# Patient Record
Sex: Male | Born: 1965 | Race: White | Hispanic: No | Marital: Single | Smoking: Current every day smoker
Health system: Southern US, Community
[De-identification: ages and names within clinical notes are randomized; demographics above are authoritative.]

## PROBLEM LIST (undated history)

## (undated) DIAGNOSIS — N3010 Interstitial cystitis (chronic) without hematuria: Secondary | ICD-10-CM

## (undated) DIAGNOSIS — I252 Old myocardial infarction: Secondary | ICD-10-CM

## (undated) DIAGNOSIS — I10 Essential (primary) hypertension: Secondary | ICD-10-CM

## (undated) DIAGNOSIS — I2510 Atherosclerotic heart disease of native coronary artery without angina pectoris: Secondary | ICD-10-CM

## (undated) DIAGNOSIS — E785 Hyperlipidemia, unspecified: Secondary | ICD-10-CM

## (undated) DIAGNOSIS — Z955 Presence of coronary angioplasty implant and graft: Secondary | ICD-10-CM

## (undated) DIAGNOSIS — K219 Gastro-esophageal reflux disease without esophagitis: Secondary | ICD-10-CM

## (undated) HISTORY — PX: CARDIAC CATHETERIZATION: SHX172

## (undated) HISTORY — PX: CORONARY ANGIOPLASTY: SHX604

## (undated) HISTORY — DX: Hyperlipidemia, unspecified: E78.5

## (undated) HISTORY — PX: CHOLECYSTECTOMY: SHX55

---

## 2010-06-06 DIAGNOSIS — I2510 Atherosclerotic heart disease of native coronary artery without angina pectoris: Secondary | ICD-10-CM | POA: Insufficient documentation

## 2010-06-06 DIAGNOSIS — F339 Major depressive disorder, recurrent, unspecified: Secondary | ICD-10-CM | POA: Insufficient documentation

## 2010-09-08 DIAGNOSIS — N3010 Interstitial cystitis (chronic) without hematuria: Secondary | ICD-10-CM | POA: Diagnosis present

## 2011-01-09 DIAGNOSIS — I10 Essential (primary) hypertension: Secondary | ICD-10-CM | POA: Diagnosis present

## 2011-01-14 DIAGNOSIS — E785 Hyperlipidemia, unspecified: Secondary | ICD-10-CM | POA: Insufficient documentation

## 2011-09-02 DIAGNOSIS — R3915 Urgency of urination: Secondary | ICD-10-CM | POA: Insufficient documentation

## 2011-09-02 DIAGNOSIS — R351 Nocturia: Secondary | ICD-10-CM | POA: Insufficient documentation

## 2011-09-02 DIAGNOSIS — R350 Frequency of micturition: Secondary | ICD-10-CM | POA: Insufficient documentation

## 2011-09-02 DIAGNOSIS — M549 Dorsalgia, unspecified: Secondary | ICD-10-CM | POA: Diagnosis present

## 2012-06-08 DIAGNOSIS — F419 Anxiety disorder, unspecified: Secondary | ICD-10-CM | POA: Insufficient documentation

## 2012-12-17 ENCOUNTER — Encounter (HOSPITAL_COMMUNITY): Payer: Self-pay | Admitting: Emergency Medicine

## 2012-12-17 ENCOUNTER — Emergency Department (HOSPITAL_COMMUNITY)
Admission: EM | Admit: 2012-12-17 | Discharge: 2012-12-17 | Disposition: A | Discharge status: Home / Self Care | Payer: Medicare Other | Attending: Emergency Medicine | Admitting: Emergency Medicine

## 2012-12-17 ENCOUNTER — Emergency Department (HOSPITAL_COMMUNITY): Payer: Medicare Other

## 2012-12-17 DIAGNOSIS — F17200 Nicotine dependence, unspecified, uncomplicated: Secondary | ICD-10-CM | POA: Insufficient documentation

## 2012-12-17 DIAGNOSIS — Z87448 Personal history of other diseases of urinary system: Secondary | ICD-10-CM | POA: Insufficient documentation

## 2012-12-17 DIAGNOSIS — S8002XA Contusion of left knee, initial encounter: Secondary | ICD-10-CM

## 2012-12-17 DIAGNOSIS — IMO0002 Reserved for concepts with insufficient information to code with codable children: Secondary | ICD-10-CM | POA: Insufficient documentation

## 2012-12-17 DIAGNOSIS — W19XXXA Unspecified fall, initial encounter: Secondary | ICD-10-CM

## 2012-12-17 DIAGNOSIS — I252 Old myocardial infarction: Secondary | ICD-10-CM | POA: Insufficient documentation

## 2012-12-17 DIAGNOSIS — S8001XA Contusion of right knee, initial encounter: Secondary | ICD-10-CM

## 2012-12-17 DIAGNOSIS — S80211A Abrasion, right knee, initial encounter: Secondary | ICD-10-CM

## 2012-12-17 DIAGNOSIS — Y9301 Activity, walking, marching and hiking: Secondary | ICD-10-CM | POA: Insufficient documentation

## 2012-12-17 DIAGNOSIS — W108XXA Fall (on) (from) other stairs and steps, initial encounter: Secondary | ICD-10-CM | POA: Insufficient documentation

## 2012-12-17 DIAGNOSIS — Z8679 Personal history of other diseases of the circulatory system: Secondary | ICD-10-CM | POA: Insufficient documentation

## 2012-12-17 DIAGNOSIS — Z79899 Other long term (current) drug therapy: Secondary | ICD-10-CM | POA: Insufficient documentation

## 2012-12-17 DIAGNOSIS — Z7982 Long term (current) use of aspirin: Secondary | ICD-10-CM | POA: Insufficient documentation

## 2012-12-17 DIAGNOSIS — Y92410 Unspecified street and highway as the place of occurrence of the external cause: Secondary | ICD-10-CM | POA: Insufficient documentation

## 2012-12-17 DIAGNOSIS — S8000XA Contusion of unspecified knee, initial encounter: Secondary | ICD-10-CM | POA: Insufficient documentation

## 2012-12-17 HISTORY — DX: Presence of coronary angioplasty implant and graft: Z95.5

## 2012-12-17 HISTORY — DX: Old myocardial infarction: I25.2

## 2012-12-17 HISTORY — DX: Interstitial cystitis (chronic) without hematuria: N30.10

## 2012-12-17 MED ORDER — NAPROXEN 500 MG PO TABS: 500.0000 mg | ORAL_TABLET | Freq: Two times a day (BID) | ORAL | Status: DC

## 2012-12-17 NOTE — ED Notes (Signed)
Patient states he tripped while coming down some stairs and landed on his knees.  Abrasions noted to right knee and patient c/o bilateral knee pain.

## 2012-12-17 NOTE — ED Notes (Signed)
Patient transported to X-ray 

## 2012-12-17 NOTE — ED Provider Notes (Signed)
History     CSN: [id number]  Arrival date & time 12/17/12  1242   First MD Initiated Contact with Patient 12/17/12 1350      Chief Complaint  Patient presents with  . Knee Pain    bilateral    (Consider location/radiation/quality/duration/timing/severity/associated sxs/prior treatment) HPI Joshua Rojas is a 47 y.o. male who presents to ED with complaint of a fall yesterday. States missed last step and landed on sidewalk. Lacerations to bilateral knees and pain to bilateral knees when walking and straightening her knees. states no weakness or numbness or injury distal to the knees. Full ROM of both knees. No other complaints. No head injury. Taking norco with no improvement.   Past Medical History  Diagnosis Date  . MI, old   . H/O heart artery stent      x 1  . Interstitial cystitis     Past Surgical History  Procedure Laterality Date  . Cholecystectomy      No family history on file.  History  Substance Use Topics  . Smoking status: Current Every Day Smoker -- 1.00 packs/day    Types: Cigarettes  . Smokeless tobacco: Not on file  . Alcohol Use: No      Review of Systems  Constitutional: Negative for fever and chills.  Musculoskeletal: Positive for joint swelling and arthralgias.  Skin: Positive for wound.  Neurological: Negative for weakness and numbness.    Allergies  Sudafed  Home Medications   Current Outpatient Rx  Name  Route  Sig  Dispense  Refill  . ALPRAZolam (XANAX) 1 MG tablet   Oral   Take 1 mg by mouth 3 (three) times daily.         Marland Kitchen aspirin EC 81 MG tablet   Oral   Take 81 mg by mouth every morning.         . citalopram (CELEXA) 40 MG tablet   Oral   Take 40 mg by mouth every evening.         . gabapentin (NEURONTIN) 600 MG tablet   Oral   Take 600 mg by mouth 3 (three) times daily.         Marland Kitchen HYDROcodone-acetaminophen (NORCO) 10-325 MG per tablet   Oral   Take 0.5-1 tablets by mouth every 6 (six) hours as needed for  pain.         . hydrOXYzine (ATARAX/VISTARIL) 25 MG tablet   Oral   Take 25 mg by mouth every evening.         . metoprolol succinate (TOPROL-XL) 100 MG 24 hr tablet   Oral   Take 100 mg by mouth every morning. Take with or immediately following a meal.         . simvastatin (ZOCOR) 20 MG tablet   Oral   Take 20 mg by mouth every evening.         . Tamsulosin HCl (FLOMAX) 0.4 MG CAPS   Oral   Take 0.4 mg by mouth every evening.         . Vitamin D, Ergocalciferol, (DRISDOL) 50000 UNITS CAPS   Oral   Take 50,000 Units by mouth every morning.           BP 130/83  Pulse 81  Temp(Src) 99.9 F (37.7 C) (Oral)  Ht 5\' 11"  (1.803 m)  Wt 260 lb (117.935 kg)  BMI 36.28 kg/m2  SpO2 98%  Physical Exam  Nursing note and vitals reviewed. Constitutional: He is oriented to person,  place, and time. He appears well-developed and well-nourished. No distress.  Cardiovascular: Normal rate, regular rhythm and normal heart sounds.   Pulmonary/Chest: Effort normal and breath sounds normal. No respiratory distress. He has no wheezes. He has no rales.  Musculoskeletal:  Abrasion to the right anterior knee. Tender to the bilateral tibial tuberosities. Full ROM of bilateral knees. Pain with extension of the knee joint against resistance. Negative anterior and posterior drawer signs. Joint stable. Patella tendon intact bilaterally  Neurological: He is alert and oriented to person, place, and time.  Skin: Skin is warm and dry.    ED Course  Procedures (including critical care time)  Labs Reviewed - No data to display Dg Knee Complete 4 Views Left  12/17/2012  *RADIOLOGY REPORT*  Clinical Data: Fall, bilateral knee pain.  LEFT KNEE - COMPLETE 4+ VIEW  Comparison: Right knee performed today.  Findings: No acute bony abnormality.  Specifically, no fracture, subluxation, or dislocation.  Soft tissues are intact. Joint spaces are maintained.  Normal bone mineralization.  No joint effusion.   IMPRESSION: No bony abnormality.   Original Report Authenticated By: Charlett Nose, M.D.    Dg Knee Complete 4 Views Right  12/17/2012  *RADIOLOGY REPORT*  Clinical Data: Fall, bilateral knee pain.  RIGHT KNEE - COMPLETE 4+ VIEW  Comparison: Left knee performed today.  Findings: No acute bony abnormality.  Specifically, no fracture, subluxation, or dislocation.  Soft tissues are intact. Joint spaces are maintained.  Normal bone mineralization.  No joint effusion.  IMPRESSION: No bony abnormality.   Original Report Authenticated By: Charlett Nose, M.D.      1. Contusion of knee, left   2. Contusion of knee, right   3. Abrasion of right knee   4. Fall       MDM  Pt with bilateral knee pain post mechanical fall yesterday. He is ambulatory. No distress. Joints stable bilaterally, full ROM. Tender over patella and tibial tuberosities. X-rays negative. Suspect contusion at the patella insertion site. Patella tendon intact. D/c home with follow up as needed.        Lottie Mussel, PA 12/17/12 1549

## 2012-12-17 NOTE — ED Provider Notes (Signed)
Medical screening examination/treatment/procedure(s) were performed by non-physician practitioner and as supervising physician I was immediately available for consultation/collaboration.  Marwan T Powers, MD 12/17/12 1649 

## 2012-12-19 DIAGNOSIS — K219 Gastro-esophageal reflux disease without esophagitis: Secondary | ICD-10-CM | POA: Diagnosis present

## 2013-07-12 DIAGNOSIS — E669 Obesity, unspecified: Secondary | ICD-10-CM | POA: Diagnosis present

## 2013-07-12 DIAGNOSIS — Z720 Tobacco use: Secondary | ICD-10-CM | POA: Insufficient documentation

## 2013-11-24 ENCOUNTER — Emergency Department (HOSPITAL_COMMUNITY): Payer: Medicare Other

## 2013-11-24 ENCOUNTER — Emergency Department (HOSPITAL_COMMUNITY)
Admission: EM | Admit: 2013-11-24 | Discharge: 2013-11-24 | Disposition: A | Discharge status: Home / Self Care | Payer: Medicare Other | Attending: Emergency Medicine | Admitting: Emergency Medicine

## 2013-11-24 ENCOUNTER — Encounter (HOSPITAL_COMMUNITY): Payer: Self-pay | Admitting: Emergency Medicine

## 2013-11-24 DIAGNOSIS — IMO0001 Reserved for inherently not codable concepts without codable children: Secondary | ICD-10-CM | POA: Insufficient documentation

## 2013-11-24 DIAGNOSIS — Z87448 Personal history of other diseases of urinary system: Secondary | ICD-10-CM | POA: Insufficient documentation

## 2013-11-24 DIAGNOSIS — J069 Acute upper respiratory infection, unspecified: Secondary | ICD-10-CM | POA: Insufficient documentation

## 2013-11-24 DIAGNOSIS — Z791 Long term (current) use of non-steroidal anti-inflammatories (NSAID): Secondary | ICD-10-CM | POA: Insufficient documentation

## 2013-11-24 DIAGNOSIS — F17200 Nicotine dependence, unspecified, uncomplicated: Secondary | ICD-10-CM | POA: Insufficient documentation

## 2013-11-24 DIAGNOSIS — Z7982 Long term (current) use of aspirin: Secondary | ICD-10-CM | POA: Insufficient documentation

## 2013-11-24 DIAGNOSIS — Z9861 Coronary angioplasty status: Secondary | ICD-10-CM | POA: Insufficient documentation

## 2013-11-24 DIAGNOSIS — I252 Old myocardial infarction: Secondary | ICD-10-CM | POA: Insufficient documentation

## 2013-11-24 DIAGNOSIS — Z79899 Other long term (current) drug therapy: Secondary | ICD-10-CM | POA: Insufficient documentation

## 2013-11-24 DIAGNOSIS — R079 Chest pain, unspecified: Secondary | ICD-10-CM | POA: Insufficient documentation

## 2013-11-24 MED ORDER — HYDROCODONE-HOMATROPINE 5-1.5 MG/5ML PO SYRP: 5.0000 mL | ORAL_SOLUTION | Freq: Four times a day (QID) | ORAL | Status: DC | PRN

## 2013-11-24 MED ORDER — AZITHROMYCIN 250 MG PO TABS: ORAL_TABLET | ORAL | Status: DC

## 2013-11-24 NOTE — ED Provider Notes (Signed)
CSN: [id number]     Arrival date & time 11/24/13  1511 History  This chart was scribed for non-physician practitioner, Arthor CaptainAbigail Angelo Caroll, PA-C working with Junius ArgyleForrest S Harrison, MD by Greggory StallionKayla Andersen, ED scribe. This patient was seen in room WTR7/WTR7 and the patient's care was started at 4:59 PM.   Chief Complaint  Patient presents with  . Nasal Congestion    2 day hx of sinus pressure  . Headache  . Cough  . Chest Pain    pressure with cough    The history is provided by the patient. No language interpreter was used.   HPI Comments: Joshua Rojas is a 48 y.o. male with history of interstitial cystitis who presents to the Emergency Department complaining of nasal congestion, sinus pressure, mild headache, chills, generalized body aches and productive cough that started 2 days ago. Pt states he called his PCP and was told to come here if he might have the flu.Patient states that he is afraid to take typical OTC cold meds because they affect his  Interstitial cystitis negatively. Denies wheezing, rash. Denies history of asthma. Pt smokes cigarettes daily.   Past Medical History  Diagnosis Date  . MI, old   . H/O heart artery stent      x 1  . Interstitial cystitis    Past Surgical History  Procedure Laterality Date  . Cholecystectomy     No family history on file. History  Substance Use Topics  . Smoking status: Current Every Day Smoker -- 1.00 packs/day    Types: Cigarettes  . Smokeless tobacco: Not on file  . Alcohol Use: No    Review of Systems  Constitutional: Positive for chills. Negative for fever.  HENT: Positive for congestion, postnasal drip and sinus pressure.   Eyes: Negative for redness.  Respiratory: Positive for cough. Negative for wheezing.   Gastrointestinal: Negative for abdominal pain.  Musculoskeletal: Positive for myalgias.  Skin: Negative for rash.  Neurological: Positive for headaches.  Psychiatric/Behavioral: Negative for confusion.    Allergies   Sudafed  Home Medications   Current Outpatient Rx  Name  Route  Sig  Dispense  Refill  . ALPRAZolam (XANAX) 1 MG tablet   Oral   Take 1 mg by mouth 3 (three) times daily.         Marland Kitchen. aspirin EC 81 MG tablet   Oral   Take 81 mg by mouth every morning.         . citalopram (CELEXA) 40 MG tablet   Oral   Take 40 mg by mouth every evening.         . gabapentin (NEURONTIN) 600 MG tablet   Oral   Take 600 mg by mouth 3 (three) times daily.         Marland Kitchen. HYDROcodone-acetaminophen (NORCO) 10-325 MG per tablet   Oral   Take 0.5-1 tablets by mouth every 6 (six) hours as needed for pain.         . hydrOXYzine (ATARAX/VISTARIL) 25 MG tablet   Oral   Take 25 mg by mouth every evening.         . metoprolol succinate (TOPROL-XL) 100 MG 24 hr tablet   Oral   Take 100 mg by mouth every morning. Take with or immediately following a meal.         . naproxen (NAPROSYN) 500 MG tablet   Oral   Take 1 tablet (500 mg total) by mouth 2 (two) times daily.   30 tablet  0   . simvastatin (ZOCOR) 20 MG tablet   Oral   Take 20 mg by mouth every evening.         . Tamsulosin HCl (FLOMAX) 0.4 MG CAPS   Oral   Take 0.4 mg by mouth every evening.         . Vitamin D, Ergocalciferol, (DRISDOL) 50000 UNITS CAPS   Oral   Take 50,000 Units by mouth every morning.          BP 122/78  Pulse 96  Temp(Src) 98.8 F (37.1 C) (Oral)  Resp 18  Wt 270 lb (122.471 kg)  SpO2 98%  Physical Exam  Nursing note and vitals reviewed. Constitutional: He is oriented to person, place, and time. He appears well-developed and well-nourished. No distress.  HENT:  Head: Normocephalic and atraumatic.  Post nasal drip noted.   Eyes: EOM are normal.  Neck: Neck supple. No tracheal deviation present.  Cardiovascular: Normal rate, regular rhythm and normal heart sounds.   Pulmonary/Chest: Effort normal and breath sounds normal. No respiratory distress. He has no wheezes. He has no rales.   Abdominal: Soft. There is no tenderness. There is no rebound and no guarding.  Musculoskeletal: Normal range of motion.  Neurological: He is alert and oriented to person, place, and time.  Skin: Skin is warm and dry.  Psychiatric: He has a normal mood and affect. His behavior is normal.    ED Course  Procedures (including critical care time)  DIAGNOSTIC STUDIES: Oxygen Saturation is 98% on RA, normal by my interpretation.    COORDINATION OF CARE: 5:06 PM-Discussed treatment plan which includes cough medication with pt at bedside and pt agreed to plan. Advised pt to fill the antibiotic prescription if symptoms do not resolve in two weeks.   Labs Review Labs Reviewed - No data to display Imaging Review Dg Chest 2 View  11/24/2013   CLINICAL DATA:  Nasal congestion.  Headache and cough.  EXAM: CHEST  2 VIEW  COMPARISON:  None.  FINDINGS: Borderline cardiomegaly is present. Diffuse interstitial coarsening is evident, likely chronic. No focal airspace consolidation is present. The visualized soft tissues and bony thorax are unremarkable.  IMPRESSION: 1. No acute cardiopulmonary disease. 2. Diffuse interstitial coarsening is likely chronic.   Electronically Signed   By: Gennette Pac M.D.   On: 11/24/2013 16:54    EKG Interpretation   None       MDM   1. URI (upper respiratory infection)    Patient given RX for abx. Asked to hold off unless sxs persist for more than 14 days. Pt CXR negative for acute infiltrate. Patients symptoms are consistent with URI, likely viral etiology. Discussed that antibiotics are not indicated for viral infections. Pt will be discharged with symptomatic treatment.  Verbalizes understanding and is agreeable with plan. Pt is hemodynamically stable & in NAD prior to dc.  I personally performed the services described in this documentation, which was scribed in my presence. The recorded information has been reviewed and is accurate.    Arthor Captain,  PA-C 11/25/13 [phone]

## 2013-11-24 NOTE — ED Notes (Signed)
Pt reports that he has sinus pressure, headache, chills, productive cough x 2 days. Did not tx with OTC meds Lungs clear, anterior, posterior. Slight decrease in posterior bases. VS stable.

## 2013-11-24 NOTE — Discharge Instructions (Signed)

## 2013-11-25 NOTE — ED Provider Notes (Signed)
Medical screening examination/treatment/procedure(s) were performed by non-physician practitioner and as supervising physician I was immediately available for consultation/collaboration.  EKG Interpretation   None         Kiahna Banghart S Brooklyn Alfredo, MD 11/25/13 1140 

## 2014-02-07 DIAGNOSIS — R52 Pain, unspecified: Secondary | ICD-10-CM | POA: Insufficient documentation

## 2015-07-25 DIAGNOSIS — J309 Allergic rhinitis, unspecified: Secondary | ICD-10-CM | POA: Insufficient documentation

## 2015-08-15 ENCOUNTER — Other Ambulatory Visit: Payer: Self-pay | Admitting: Urology

## 2015-08-15 DIAGNOSIS — R319 Hematuria, unspecified: Secondary | ICD-10-CM

## 2015-08-15 DIAGNOSIS — R109 Unspecified abdominal pain: Secondary | ICD-10-CM

## 2015-08-22 ENCOUNTER — Ambulatory Visit
Admission: RE | Admit: 2015-08-22 | Discharge: 2015-08-22 | Disposition: A | Discharge status: Home / Self Care | Payer: Medicare Other | Source: Ambulatory Visit | Attending: Urology | Admitting: Urology

## 2015-08-22 ENCOUNTER — Other Ambulatory Visit: Payer: Self-pay | Admitting: Urology

## 2015-08-22 DIAGNOSIS — R109 Unspecified abdominal pain: Secondary | ICD-10-CM

## 2015-08-22 DIAGNOSIS — R319 Hematuria, unspecified: Secondary | ICD-10-CM

## 2015-08-22 MED ORDER — IOPAMIDOL (ISOVUE-300) INJECTION 61%
125.0000 mL | Freq: Once | INTRAVENOUS | Status: AC | PRN
  Administered 2015-08-22: 125 mL via INTRAVENOUS

## 2017-04-22 DIAGNOSIS — Z79891 Long term (current) use of opiate analgesic: Secondary | ICD-10-CM | POA: Insufficient documentation

## 2018-01-24 DIAGNOSIS — Z8639 Personal history of other endocrine, nutritional and metabolic disease: Secondary | ICD-10-CM | POA: Insufficient documentation

## 2018-02-25 ENCOUNTER — Encounter (HOSPITAL_COMMUNITY): Payer: Self-pay

## 2018-02-25 ENCOUNTER — Other Ambulatory Visit: Payer: Self-pay

## 2018-02-25 ENCOUNTER — Emergency Department (HOSPITAL_COMMUNITY): Payer: Medicare Other

## 2018-02-25 ENCOUNTER — Inpatient Hospital Stay (HOSPITAL_COMMUNITY)
Admission: EM | Admit: 2018-02-25 | Discharge: 2018-03-01 | DRG: 247 | Disposition: A | Discharge status: Home / Self Care | Payer: Medicare Other | Attending: Internal Medicine | Admitting: Internal Medicine

## 2018-02-25 DIAGNOSIS — E785 Hyperlipidemia, unspecified: Secondary | ICD-10-CM | POA: Diagnosis present

## 2018-02-25 DIAGNOSIS — N3010 Interstitial cystitis (chronic) without hematuria: Secondary | ICD-10-CM | POA: Diagnosis not present

## 2018-02-25 DIAGNOSIS — R072 Precordial pain: Secondary | ICD-10-CM

## 2018-02-25 DIAGNOSIS — I214 Non-ST elevation (NSTEMI) myocardial infarction: Secondary | ICD-10-CM | POA: Diagnosis not present

## 2018-02-25 DIAGNOSIS — I2510 Atherosclerotic heart disease of native coronary artery without angina pectoris: Secondary | ICD-10-CM | POA: Diagnosis not present

## 2018-02-25 DIAGNOSIS — I10 Essential (primary) hypertension: Secondary | ICD-10-CM | POA: Diagnosis not present

## 2018-02-25 DIAGNOSIS — K219 Gastro-esophageal reflux disease without esophagitis: Secondary | ICD-10-CM | POA: Diagnosis present

## 2018-02-25 DIAGNOSIS — I252 Old myocardial infarction: Secondary | ICD-10-CM

## 2018-02-25 DIAGNOSIS — Z79899 Other long term (current) drug therapy: Secondary | ICD-10-CM

## 2018-02-25 DIAGNOSIS — R001 Bradycardia, unspecified: Secondary | ICD-10-CM | POA: Diagnosis not present

## 2018-02-25 DIAGNOSIS — F329 Major depressive disorder, single episode, unspecified: Secondary | ICD-10-CM | POA: Diagnosis present

## 2018-02-25 DIAGNOSIS — Z955 Presence of coronary angioplasty implant and graft: Secondary | ICD-10-CM

## 2018-02-25 DIAGNOSIS — T50905A Adverse effect of unspecified drugs, medicaments and biological substances, initial encounter: Secondary | ICD-10-CM | POA: Diagnosis not present

## 2018-02-25 DIAGNOSIS — F17210 Nicotine dependence, cigarettes, uncomplicated: Secondary | ICD-10-CM | POA: Diagnosis present

## 2018-02-25 DIAGNOSIS — F32A Depression, unspecified: Secondary | ICD-10-CM | POA: Diagnosis present

## 2018-02-25 DIAGNOSIS — Z6836 Body mass index (BMI) 36.0-36.9, adult: Secondary | ICD-10-CM

## 2018-02-25 DIAGNOSIS — M549 Dorsalgia, unspecified: Secondary | ICD-10-CM | POA: Diagnosis present

## 2018-02-25 DIAGNOSIS — E669 Obesity, unspecified: Secondary | ICD-10-CM | POA: Diagnosis present

## 2018-02-25 DIAGNOSIS — R079 Chest pain, unspecified: Secondary | ICD-10-CM | POA: Diagnosis present

## 2018-02-25 DIAGNOSIS — F419 Anxiety disorder, unspecified: Secondary | ICD-10-CM | POA: Diagnosis present

## 2018-02-25 DIAGNOSIS — Z888 Allergy status to other drugs, medicaments and biological substances status: Secondary | ICD-10-CM

## 2018-02-25 DIAGNOSIS — Z7982 Long term (current) use of aspirin: Secondary | ICD-10-CM

## 2018-02-25 HISTORY — DX: Gastro-esophageal reflux disease without esophagitis: K21.9

## 2018-02-25 HISTORY — DX: Essential (primary) hypertension: I10

## 2018-02-25 HISTORY — DX: Atherosclerotic heart disease of native coronary artery without angina pectoris: I25.10

## 2018-02-25 LAB — BASIC METABOLIC PANEL
Anion gap: 13 (ref 5–15)
BUN: 8 mg/dL (ref 6–20)
CO2: 20 mmol/L — ABNORMAL LOW (ref 22–32)
CREATININE: 1 mg/dL (ref 0.61–1.24)
Calcium: 8.5 mg/dL — ABNORMAL LOW (ref 8.9–10.3)
Chloride: 107 mmol/L (ref 101–111)
GFR calc Af Amer: >60 mL/min (ref >60)
GFR calc non Af Amer: >60 mL/min (ref >60)
GLUCOSE: 186 mg/dL — AB (ref 65–99)
POTASSIUM: 3.6 mmol/L (ref 3.5–5.1)
Sodium: 140 mmol/L (ref 135–145)

## 2018-02-25 LAB — HEPATIC FUNCTION PANEL
ALT: 31 U/L (ref 17–63)
AST: 29 U/L (ref 15–41)
Albumin: 3.9 g/dL (ref 3.5–5.0)
Alkaline Phosphatase: 69 U/L (ref 38–126)
Bilirubin, Direct: <0.1 mg/dL — ABNORMAL LOW (ref 0.1–0.5)
Total Bilirubin: 0.8 mg/dL (ref 0.3–1.2)
Total Protein: 6.6 g/dL (ref 6.5–8.1)

## 2018-02-25 LAB — CBC
HEMATOCRIT: 47.2 % (ref 39.0–52.0)
Hemoglobin: 16.5 g/dL (ref 13.0–17.0)
MCH: 31.9 pg (ref 26.0–34.0)
MCHC: 35 g/dL (ref 30.0–36.0)
MCV: 91.3 fL (ref 78.0–100.0)
PLATELETS: 176 10*3/uL (ref 150–400)
RBC: 5.17 MIL/uL (ref 4.22–5.81)
RDW: 12.4 % (ref 11.5–15.5)
WBC: 10.4 10*3/uL (ref 4.0–10.5)

## 2018-02-25 LAB — D-DIMER, QUANTITATIVE (NOT AT ARMC): D DIMER QUANT: 0.27 ug{FEU}/mL (ref 0.00–0.50)

## 2018-02-25 LAB — LIPASE, BLOOD: LIPASE: 43 U/L (ref 11–51)

## 2018-02-25 LAB — TSH: TSH: 0.572 u[IU]/mL (ref 0.350–4.500)

## 2018-02-25 LAB — I-STAT TROPONIN, ED
Troponin i, poc: 0.02 ng/mL (ref 0.00–0.08)
Troponin i, poc: 0.04 ng/mL (ref 0.00–0.08)

## 2018-02-25 LAB — BRAIN NATRIURETIC PEPTIDE: B NATRIURETIC PEPTIDE 5: 50.1 pg/mL (ref 0.0–100.0)

## 2018-02-25 MED ORDER — ALPRAZOLAM 0.5 MG PO TABS: 0.5000 mg | ORAL_TABLET | ORAL | Status: DC

## 2018-02-25 MED ORDER — ALPRAZOLAM 0.5 MG PO TABS
2.0000 mg | ORAL_TABLET | Freq: Every day | ORAL | Status: DC
  Administered 2018-02-25 – 2018-02-28 (×4): 2 mg via ORAL
  Filled 2018-02-25 (×4): qty 4

## 2018-02-25 MED ORDER — ONDANSETRON HCL 4 MG/2ML IJ SOLN: 4.0000 mg | Freq: Four times a day (QID) | INTRAMUSCULAR | Status: DC | PRN

## 2018-02-25 MED ORDER — HYDROCODONE-ACETAMINOPHEN 10-325 MG PO TABS
0.5000 | ORAL_TABLET | ORAL | Status: DC
  Administered 2018-02-25 – 2018-02-26 (×2): 1 via ORAL
  Filled 2018-02-25 (×2): qty 1

## 2018-02-25 MED ORDER — ACETAMINOPHEN 325 MG PO TABS: 650.0000 mg | ORAL_TABLET | ORAL | Status: DC | PRN

## 2018-02-25 MED ORDER — CITALOPRAM HYDROBROMIDE 20 MG PO TABS
40.0000 mg | ORAL_TABLET | Freq: Every day | ORAL | Status: DC
  Administered 2018-02-25 – 2018-02-28 (×4): 40 mg via ORAL
  Filled 2018-02-25: qty 4
  Filled 2018-02-25 (×3): qty 2

## 2018-02-25 MED ORDER — MORPHINE SULFATE (PF) 4 MG/ML IV SOLN: 4.0000 mg | Freq: Once | INTRAVENOUS | Status: DC

## 2018-02-25 MED ORDER — HYDROXYZINE HCL 25 MG PO TABS
25.0000 mg | ORAL_TABLET | Freq: Every day | ORAL | Status: DC
  Administered 2018-02-25 – 2018-02-28 (×4): 25 mg via ORAL
  Filled 2018-02-25 (×5): qty 1

## 2018-02-25 MED ORDER — MORPHINE SULFATE (PF) 4 MG/ML IV SOLN
2.0000 mg | INTRAVENOUS | Status: DC | PRN
  Administered 2018-02-26 – 2018-02-28 (×3): 2 mg via INTRAVENOUS
  Filled 2018-02-25 (×3): qty 1

## 2018-02-25 MED ORDER — ONDANSETRON HCL 4 MG/2ML IJ SOLN: 4.0000 mg | Freq: Once | INTRAMUSCULAR | Status: DC

## 2018-02-25 MED ORDER — SIMVASTATIN 20 MG PO TABS
20.0000 mg | ORAL_TABLET | Freq: Every day | ORAL | Status: DC
  Administered 2018-02-26 (×2): 20 mg via ORAL
  Filled 2018-02-25 (×2): qty 1

## 2018-02-25 MED ORDER — NITROGLYCERIN 0.4 MG SL SUBL: 0.4000 mg | SUBLINGUAL_TABLET | SUBLINGUAL | Status: DC | PRN

## 2018-02-25 MED ORDER — GI COCKTAIL ~~LOC~~: 30.0000 mL | Freq: Four times a day (QID) | ORAL | Status: DC | PRN

## 2018-02-25 MED ORDER — ALPRAZOLAM 0.5 MG PO TABS
0.5000 mg | ORAL_TABLET | Freq: Two times a day (BID) | ORAL | Status: DC | PRN
  Administered 2018-02-26 (×2): 0.5 mg via ORAL
  Filled 2018-02-25 (×2): qty 1

## 2018-02-25 MED ORDER — METOPROLOL SUCCINATE ER 100 MG PO TB24
100.0000 mg | ORAL_TABLET | Freq: Every day | ORAL | Status: DC
  Administered 2018-02-26 – 2018-02-27 (×2): 100 mg via ORAL
  Filled 2018-02-25 (×2): qty 1

## 2018-02-25 MED ORDER — AZELASTINE HCL 0.1 % NA SOLN: 2.0000 | Freq: Every day | NASAL | Status: DC | PRN

## 2018-02-25 MED ORDER — PANTOPRAZOLE SODIUM 40 MG PO TBEC
40.0000 mg | DELAYED_RELEASE_TABLET | Freq: Every day | ORAL | Status: DC
  Administered 2018-02-25 – 2018-02-28 (×4): 40 mg via ORAL
  Filled 2018-02-25 (×4): qty 1

## 2018-02-25 MED ORDER — GABAPENTIN 300 MG PO CAPS
600.0000 mg | ORAL_CAPSULE | Freq: Three times a day (TID) | ORAL | Status: DC
  Administered 2018-02-25 – 2018-03-01 (×12): 600 mg via ORAL
  Filled 2018-02-25 (×12): qty 2

## 2018-02-25 MED ORDER — ENOXAPARIN SODIUM 40 MG/0.4ML ~~LOC~~ SOLN
40.0000 mg | Freq: Every day | SUBCUTANEOUS | Status: DC
  Administered 2018-02-26: 40 mg via SUBCUTANEOUS
  Filled 2018-02-25: qty 0.4

## 2018-02-25 MED ORDER — FLUTICASONE PROPIONATE 50 MCG/ACT NA SUSP: 2.0000 | Freq: Every day | NASAL | Status: DC | PRN

## 2018-02-25 MED ORDER — TAMSULOSIN HCL 0.4 MG PO CAPS
0.4000 mg | ORAL_CAPSULE | Freq: Every day | ORAL | Status: DC
  Administered 2018-02-25 – 2018-02-28 (×4): 0.4 mg via ORAL
  Filled 2018-02-25 (×4): qty 1

## 2018-02-25 NOTE — ED Provider Notes (Signed)
Medical screening examination/treatment/procedure(s) were conducted as a shared visit with non-physician practitioner(s) and myself.  I personally evaluated the patient during the encounter. Briefly, the patient is a 52 y.o. male with a history of prior MIs status post stenting, hypertension, hyperlipidemia, smoker who presents with substernal chest pain is similar to prior MI.  EKG without evidence of acute ischemia or pericarditis.  Initial troponin negative. Low pretest probability for pulmonary embolism.  Presentation not classic for aortic dissection or esophageal perforation. Chest x-ray without evidence suggestive of pneumonia, pneumothorax, pneumomediastinum.  No abnormal contour of the mediastinum to suggest dissection. No evidence of acute injuries. Patient high risk he will require admission for ACS rule out.      EKG Interpretation  Date/Time:  Friday February 25 2018 17:47:22 EDT Ventricular Rate:  66 PR Interval:    QRS Duration: 103 QT Interval:  427 QTC Calculation: 448 R Axis:   39 Text Interpretation:  Sinus rhythm Low voltage, precordial leads RSR' in V1 or V2, right VCD or RVH NO STEMI No old tracing to compare Confirmed by Drema Pryardama, Dewane Timson [phone] on 02/25/2018 6:06:30 PM           Eudelia Bunchardama, Amadeo GarnetPedro Eduardo, MD 02/25/18 2029

## 2018-02-25 NOTE — ED Provider Notes (Signed)
[facility] Provider Note   CSN: [id number] Arrival date & time: 02/25/18  1747     History   Chief Complaint Chief Complaint  Patient presents with  . Chest Pain    HPI Joshua Rojas is a 52 y.o. male.  Patient with history of MI approximately 20 years ago [place], no current cardiologist, history of hypertension high cholesterol, smoking --presents with acute onset of right-sided chest pain with radiation into his right neck and shoulder and also some mild epigastric pain.  Pain was present when he awoke from sleep at approximately 3:30 PM today.  He had some diaphoresis and nausea without vomiting.  EMS was called.  They gave aspirin and nitroglycerin.  Nitroglycerin helped the chest pain slightly, but caused his bladder to spasm (history of interstitial cystitis).  Pain is now worsened to a 6 out of 10.  Patient is not short of breath.  He reminds him somewhat of his previous MI.  He has had some similar short-lived mild pains recently not brought on with exertion. No recent stress testing.      Past Medical History:  Diagnosis Date  . H/O heart artery stent     x 1  . Hypertension   . Interstitial cystitis   . MI, old     There are no active problems to display for this patient.   Past Surgical History:  Procedure Laterality Date  . CHOLECYSTECTOMY          Home Medications    Prior to Admission medications   Medication Sig Start Date End Date Taking? Authorizing Provider  ALPRAZolam Prudy Feeler(XANAX) 1 MG tablet Take 1 mg by mouth 3 (three) times daily.    [provider]  aspirin EC 81 MG tablet Take 81 mg by mouth every morning.    [provider]  azithromycin (ZITHROMAX Z-PAK) 250 MG tablet 2 po day one, then 1 daily x 4 days 11/24/13   Arthor CaptainHarris, Abigail, PA-C  Cholecalciferol (VITAMIN D3) 5000 UNITS CAPS Take 1 capsule by mouth daily.    [provider]  citalopram (CELEXA)  40 MG tablet Take 40 mg by mouth every evening.    [provider]  gabapentin (NEURONTIN) 600 MG tablet Take 600 mg by mouth 3 (three) times daily.    [provider]  HYDROcodone-acetaminophen (NORCO) 10-325 MG per tablet Take 0.5-1 tablets by mouth every 6 (six) hours as needed for pain.    [provider]  HYDROcodone-homatropine (HYCODAN) 5-1.5 MG/5ML syrup Take 5 mLs by mouth every 6 (six) hours as needed for cough. 11/24/13   Arthor CaptainHarris, Abigail, PA-C  hydrOXYzine (ATARAX/VISTARIL) 25 MG tablet Take 25 mg by mouth every evening.    [provider]  metoprolol succinate (TOPROL-XL) 100 MG 24 hr tablet Take 100 mg by mouth every morning. Take with or immediately following a meal.    [provider]  simvastatin (ZOCOR) 20 MG tablet Take 20 mg by mouth every evening.    [provider]  Tamsulosin HCl (FLOMAX) 0.4 MG CAPS Take 0.4 mg by mouth every evening.    [provider]    Family History Family History  Problem Relation Age of Onset  . Diabetes Father     Social History Social History   Tobacco Use  . Smoking status: Current Every Day Smoker    Packs/day: 1.00    Types: Cigarettes  Substance Use Topics  . Alcohol use: No  .  Drug use: No     Allergies   Benadryl [diphenhydramine hcl]; Sudafed [pseudoephedrine]; and Triprolidine-pse   Review of Systems Review of Systems  Constitutional: Negative for diaphoresis and fever.  Eyes: Negative for redness.  Respiratory: Negative for cough and shortness of breath.   Cardiovascular: Positive for chest pain. Negative for palpitations and leg swelling.  Gastrointestinal: Positive for abdominal pain. Negative for nausea and vomiting.  Genitourinary: Negative for dysuria.  Musculoskeletal: Negative for back pain and neck pain.  Skin: Negative for rash.  Neurological: Negative for syncope and light-headedness.  Psychiatric/Behavioral: The patient is not nervous/anxious.       Physical Exam Updated Vital Signs BP 130/66 (BP Location: Right Arm)   Pulse 65   Temp 98.4 F (36.9 C) (Oral)   Resp 16   Ht 5\' 11"  (1.803 m)   Wt 122.5 kg (270 lb)   SpO2 93%   BMI 37.66 kg/m   Physical Exam  Constitutional: He appears well-developed and well-nourished.  HENT:  Head: Normocephalic and atraumatic.  Mouth/Throat: Mucous membranes are normal. Mucous membranes are not dry.  Eyes: Conjunctivae are normal.  Neck: Trachea normal and normal range of motion. Neck supple. Normal carotid pulses and no JVD present. No muscular tenderness present. Carotid bruit is not present. No tracheal deviation present.  Cardiovascular: Normal rate, regular rhythm, S1 normal, S2 normal, normal heart sounds and intact distal pulses. Exam reveals no distant heart sounds and no decreased pulses.  No murmur heard. Pulmonary/Chest: Effort normal and breath sounds normal. No respiratory distress. He has no wheezes. He exhibits no tenderness.  Abdominal: Soft. Normal aorta and bowel sounds are normal. There is no tenderness. There is no rebound and no guarding.  Musculoskeletal: He exhibits no edema.  Neurological: He is alert.  Skin: Skin is warm and dry. He is not diaphoretic. No cyanosis. No pallor.  Psychiatric: He has a normal mood and affect.  Nursing note and vitals reviewed.    ED Treatments / Results  Labs (all labs ordered are listed, but only abnormal results are displayed) Labs Reviewed  BASIC METABOLIC PANEL - Abnormal; Notable for the following components:      Result Value   CO2 20 (*)    Glucose, Bld 186 (*)    Calcium 8.5 (*)    All other components within normal limits  HEPATIC FUNCTION PANEL - Abnormal; Notable for the following components:   Bilirubin, Direct <0.1 (*)    All other components within normal limits  CBC  LIPASE, BLOOD  I-STAT TROPONIN, ED  I-STAT TROPONIN, ED    EKG EKG Interpretation  Date/Time:  Friday February 25 2018 17:47:22  EDT Ventricular Rate:  66 PR Interval:    QRS Duration: 103 QT Interval:  427 QTC Calculation: 448 R Axis:   39 Text Interpretation:  Sinus rhythm Low voltage, precordial leads RSR' in V1 or V2, right VCD or RVH NO STEMI No old tracing to compare Confirmed by Drema Pry [phone] on 02/25/2018 6:06:30 PM   Radiology No results found.  Procedures Procedures (including critical care time)  Medications Ordered in ED Medications - No data to display   Initial Impression / Assessment and Plan / ED Course  I have reviewed the triage vital signs and the nursing notes.  Pertinent labs & imaging results that were available during my care of the patient were reviewed by me and considered in my medical decision making (see chart for details).     Patient seen and examined.  He is a high risk individual with previous MI.  No recent stress testing for risk stratification.  EKG nonischemic.  Will treat pain and perform cardiac work-up.  Vital signs reviewed and are as follows: BP 130/66 (BP Location: Right Arm)   Pulse 65   Temp 98.4 F (36.9 C) (Oral)   Resp 16   Ht 5\' 11"  (1.803 m)   Wt 122.5 kg (270 lb)   SpO2 93%   BMI 37.66 kg/m   8:36 PM Patient rechecked. Pain improved. Awaiting 2nd marker. Will need admit.   9:22 PM Trop 0.02 --> 0.04.   Spoke with Dr. Katrinka Blazing who will see.   Final Clinical Impressions(s) / ED Diagnoses   Final diagnoses:  Precordial pain   Admit CP obs, moderate to high risk patient with CP with some typical features (radiation and reported diaphoresis).  ED Discharge Orders    None       Renne Crigler, [place] 02/25/18 2123

## 2018-02-25 NOTE — H&P (Signed)
History and Physical    Joshua Rojas UJW:[id number] DOB: [birth year] DOA: 02/25/2018  Referring MD/NP/PA: Renne Crigler PA-C PCP: [facility]  Patient coming from: home  Chief Complaint: chest pain  I have personally briefly reviewed patient's old medical records in [facility]  HPI: Joshua Rojas is a 52 y.o. male with medical history significant of HTN, CAD s/p PCI in 2011, MI in 1997, interstitial cystitis, anxiety/depression; who presents with complaints of right-sided chest pain.  Patient reports being acutely woken out of his sleep with right  substernal chest tightness that he rated as a 9 out of 10 on the pain scale.  The pain radiated to his jaw and right arm.  Initially thought symptoms may be related to gas, but when they persisted became more concerned.  Associated symptoms included diaphoresis, belching, nausea, hard to take a deep inspiratory breath, and reports weight gain of 35 pounds in the last few months.  She denies any fever, recent travel, leg swelling, calf pain, or vomiting.  He felt that his symptoms are similar to his previous heart attack that he had had years ago.  He took 2 baby aspirin and called EMS.  Upon EMS arrival patient was given 2 additional baby aspirin to chew and 1 nitroglycerin.  Patient notes being unable to tolerate nitroglycerin due to worsening his interstitial cystitis symptoms.   ED Course: Admission into the emergency department patient was noted to be afebrile, pulse 41-65, O2 saturations appear to be maintained on room air, and all other vital signs within normal limits.  Labs revealed glucose 186, troponin negative x2.  EKG shows sinus rhythm with low voltage, but no clear signs of ischemia.  Chest x-ray showed no acute cardiopulmonary disease.  TRH called to admit.     Review of Systems  Constitutional: Positive for diaphoresis and malaise/fatigue. Negative for chills and fever.       Positive for weight gain    HENT: Negative for ear discharge and sinus pain.   Eyes: Negative for pain and discharge.  Respiratory: Negative for cough and shortness of breath.   Cardiovascular: Positive for chest pain. Negative for orthopnea and leg swelling.  Gastrointestinal: Positive for nausea. Negative for abdominal pain and vomiting.  Genitourinary: Positive for dysuria. Negative for flank pain.  Musculoskeletal: Negative for falls and joint pain.  Skin: Negative for itching and rash.  Neurological: Negative for focal weakness and loss of consciousness.  Psychiatric/Behavioral: Negative for memory loss and suicidal ideas.    Past Medical History:  Diagnosis Date  . H/O heart artery stent     x 1  . Hypertension   . Interstitial cystitis   . MI, old     Past Surgical History:  Procedure Laterality Date  . CHOLECYSTECTOMY       reports that he has been smoking cigarettes.  He has been smoking about 1.00 pack per day. He does not have any smokeless tobacco history on file. He reports that he does not drink alcohol or use drugs.  Allergies  Allergen Reactions  . Benadryl [Diphenhydramine Hcl] Other (See Comments)    Makes interstitial cystitis worse  . Sudafed [Pseudoephedrine] Other (See Comments)    Makes interstitial cystitis worse  . Triprolidine-Pse Other (See Comments)    Makes interstitial cystitis worse    Family History  Problem Relation Age of Onset  . Diabetes Father     Prior to Admission medications   Medication Sig Start Date End Date  Taking? Authorizing Provider  ALPRAZolam Prudy Feeler) 1 MG tablet Take 0.5-2 mg by mouth See admin instructions. Take 0.5 mg by mouth two times a day and 2 mg at bedtime   Yes [provider]  aspirin, 81 MG tablet Take 81 mg by mouth daily.    Yes [provider]  azelastine (ASTELIN) 0.1 % nasal spray Place 2 sprays into both nostrils daily as needed for rhinitis or allergies.  01/26/18  Yes [provider]  citalopram  (CELEXA) 40 MG tablet Take 40 mg by mouth at bedtime.    Yes [provider]  fluticasone (FLONASE) 50 MCG/ACT nasal spray Place 2 sprays into both nostrils daily as needed for allergies or rhinitis.  01/12/18  Yes [provider]  gabapentin (NEURONTIN) 600 MG tablet Take 600 mg by mouth 3 (three) times daily.   Yes [provider]  HYDROcodone-acetaminophen (NORCO) 10-325 MG per tablet Take 0.5-1 tablets by mouth See admin instructions. Take 1 tablet by mouth three times a day and may take an additional 0.5-1 tablet daily as needed   Yes [provider]  hydrOXYzine (ATARAX/VISTARIL) 25 MG tablet Take 25 mg by mouth at bedtime.    Yes [provider]  metoprolol succinate (TOPROL-XL) 100 MG 24 hr tablet Take 100 mg by mouth daily. Take with or immediately following a meal.    Yes [provider]  omeprazole (PRILOSEC) 20 MG capsule Take 20 mg by mouth daily with breakfast.   Yes [provider]  simvastatin (ZOCOR) 20 MG tablet Take 20 mg by mouth at bedtime.    Yes [provider]  sodium chloride (OCEAN) 0.65 % nasal spray Place 1 spray into the nose 3 (three) times daily as needed for congestion.    Yes [provider]  Tamsulosin HCl (FLOMAX) 0.4 MG CAPS Take 0.4 mg by mouth at bedtime.    Yes [provider]  azithromycin (ZITHROMAX Z-PAK) 250 MG tablet 2 po day one, then 1 daily x 4 days Patient not taking: Reported on 02/25/2018 11/24/13   Arthor Captain, PA-C  HYDROcodone-homatropine [facility]) 5-1.5 MG/5ML syrup Take 5 mLs by mouth every 6 (six) hours as needed for cough. Patient not taking: Reported on 02/25/2018 11/24/13   Arthor Captain, PA-C    Physical Exam:  Constitutional: NAD, calm, comfortable Vitals:   02/25/18 1900 02/25/18 1930 02/25/18 2000 02/25/18 2031  BP: 124/74 127/66 126/76 (!) 126/57  Pulse: (!) 55 (!) 51 (!) 41 (!) 58  Resp: 16 12 14 16   Temp:      TempSrc:      SpO2: 97%  93% 91% 97%  Weight:      Height:       Eyes: PERRL, lids and conjunctivae normal ENMT: Mucous membranes are moist. Posterior pharynx clear of any exudate or lesions. Normal dentition.  Neck: normal, supple, no masses, no thyromegaly Respiratory: clear to auscultation bilaterally, no wheezing, no crackles. Normal respiratory effort. No accessory muscle use.  Cardiovascular: Regular rate and rhythm, no murmurs / rubs / gallops. No extremity edema. 2+ pedal pulses. No carotid bruits.  Abdomen: no tenderness, no masses palpated. No hepatosplenomegaly. Bowel sounds positive.  Musculoskeletal: no clubbing / cyanosis. No joint deformity upper and lower extremities. Good ROM, no contractures. Normal muscle tone.  Skin: no rashes, lesions, ulcers. No induration Neurologic: CN 2-12 grossly intact. Sensation intact, DTR normal. Strength 5/5 in all 4.  Psychiatric: Normal judgment and insight. Alert and oriented x 3. Normal mood.  Labs on Admission: I have personally reviewed following labs and imaging studies  CBC: Recent Labs  Lab 02/25/18 1804  WBC 10.4  HGB 16.5  HCT 47.2  MCV 91.3  PLT 176   Basic Metabolic Panel: Recent Labs  Lab 02/25/18 1804  NA 140  K 3.6  CL 107  CO2 20*  GLUCOSE 186*  BUN 8  CREATININE 1.00  CALCIUM 8.5*   GFR: Estimated Creatinine Clearance: 117.8 mL/min (by C-G formula based on SCr of 1 mg/dL). Liver Function Tests: Recent Labs  Lab 02/25/18 1804  AST 29  ALT 31  ALKPHOS 69  BILITOT 0.8  PROT 6.6  ALBUMIN 3.9   Recent Labs  Lab 02/25/18 1804  LIPASE 43   No results for input(s): AMMONIA in the last 168 hours. Coagulation Profile: No results for input(s): INR, PROTIME in the last 168 hours. Cardiac Enzymes: No results for input(s): CKTOTAL, CKMB, CKMBINDEX, TROPONINI in the last 168 hours. BNP (last 3 results) No results for input(s): PROBNP in the last 8760 hours. HbA1C: No results for input(s): HGBA1C in the last 72  hours. CBG: No results for input(s): GLUCAP in the last 168 hours. Lipid Profile: No results for input(s): CHOL, HDL, LDLCALC, TRIG, CHOLHDL, LDLDIRECT in the last 72 hours. Thyroid Function Tests: No results for input(s): TSH, T4TOTAL, FREET4, T3FREE, THYROIDAB in the last 72 hours. Anemia Panel: No results for input(s): VITAMINB12, FOLATE, FERRITIN, TIBC, IRON, RETICCTPCT in the last 72 hours. Urine analysis: No results found for: COLORURINE, APPEARANCEUR, LABSPEC, PHURINE, GLUCOSEU, HGBUR, BILIRUBINUR, KETONESUR, PROTEINUR, UROBILINOGEN, NITRITE, LEUKOCYTESUR Sepsis Labs: No results found for this or any previous visit (from the past 240 hour(s)).   Radiological Exams on Admission: Dg Chest 2 View  Result Date: 02/25/2018 CLINICAL DATA:  Chest pain EXAM: CHEST - 2 VIEW COMPARISON:  11/24/2013 FINDINGS: The heart size and mediastinal contours are within normal limits allowing for AP technique. Both lungs are clear. The visualized skeletal structures are unremarkable. IMPRESSION: No active cardiopulmonary disease. Electronically Signed   By: Deatra RobinsonKevin  Herman M.D.   On: 02/25/2018 19:28    EKG: Independently reviewed.  Sinus rhythm with low voltage at 66 bpm  Assessment/Plan Chest pain: Acute.  Heart score = 4.  Patient presents with complaints of right-sided chest pain similar to previous MI.  Patient received full dose aspirin and nitroglycerin en route. - Admit to the telemetry bed - Chest pain order set initiated - Trend cardiac troponins x3 overnight - Check UDS, d-dimer, TSH, and BMP - Check echocardiogram in a.m. - Check lipid panel in a.m.  - Nitroglycerin as needed for chest pain  Essential hypertension - Continue metoprolol  CAD s/p PCI: Patient gives a previous history of MI 1997 and placement in 2011.  Patient does not follow with a cardiologist.  Reports taking a daily aspirin. - Determine which aspirin dosage to continue  Hyperlipidemia - Continue  simvastatin  Anxiety/depression - Continue Celexa   Interstitial cystitis - Continue gabapentin, hydrocodone prn, and Xanax prn   GERD - Continue pharmacy substitution of Protonix  DVT prophylaxis: lovenox Code Status: Full Family Communication: Discussed plan of care with the patient family present at bedside Disposition Plan: Likely discharge home once medically cleared Consults called: None Admission status: Observation  Clydie Braunondell A Barbaraann Avans MD [facility] Pager [phone]   If 7PM-7AM, please contact night-coverage [email or web address] Password [email or web address]  02/25/2018, 9:13 PM

## 2018-02-25 NOTE — ED Triage Notes (Signed)
Pt to ED via EMS.  Reports chest pain that awakened him from sleep at 1520 this afternoon.  Description of right sided chest pain with radiation to right jaw and right arm.  Pt awoke lying on his right side.  Pt reports hx of MI with stent >20years ago.  Pt has 5/10 CP upon arrival to ED.  Skin is PWD.  NAD noted

## 2018-02-26 ENCOUNTER — Encounter (HOSPITAL_COMMUNITY): Payer: Self-pay

## 2018-02-26 ENCOUNTER — Observation Stay (HOSPITAL_BASED_OUTPATIENT_CLINIC_OR_DEPARTMENT_OTHER): Payer: Medicare Other

## 2018-02-26 DIAGNOSIS — I2510 Atherosclerotic heart disease of native coronary artery without angina pectoris: Secondary | ICD-10-CM | POA: Diagnosis present

## 2018-02-26 DIAGNOSIS — E7800 Pure hypercholesterolemia, unspecified: Secondary | ICD-10-CM

## 2018-02-26 DIAGNOSIS — R079 Chest pain, unspecified: Secondary | ICD-10-CM

## 2018-02-26 DIAGNOSIS — N3010 Interstitial cystitis (chronic) without hematuria: Secondary | ICD-10-CM | POA: Diagnosis not present

## 2018-02-26 DIAGNOSIS — I2583 Coronary atherosclerosis due to lipid rich plaque: Secondary | ICD-10-CM

## 2018-02-26 DIAGNOSIS — I214 Non-ST elevation (NSTEMI) myocardial infarction: Principal | ICD-10-CM

## 2018-02-26 DIAGNOSIS — E785 Hyperlipidemia, unspecified: Secondary | ICD-10-CM | POA: Diagnosis present

## 2018-02-26 DIAGNOSIS — I10 Essential (primary) hypertension: Secondary | ICD-10-CM

## 2018-02-26 DIAGNOSIS — K219 Gastro-esophageal reflux disease without esophagitis: Secondary | ICD-10-CM | POA: Diagnosis present

## 2018-02-26 LAB — CBC WITH DIFFERENTIAL/PLATELET
BASOS ABS: 0.1 10*3/uL (ref 0.0–0.1)
Basophils Relative: 1 %
EOS ABS: 0.1 10*3/uL (ref 0.0–0.7)
Eosinophils Relative: 2 %
HCT: 48.9 % (ref 39.0–52.0)
Hemoglobin: 16.5 g/dL (ref 13.0–17.0)
LYMPHS ABS: 3.8 10*3/uL (ref 0.7–4.0)
Lymphocytes Relative: 45 %
MCH: 31.4 pg (ref 26.0–34.0)
MCHC: 33.7 g/dL (ref 30.0–36.0)
MCV: 93.1 fL (ref 78.0–100.0)
Monocytes Absolute: 0.5 10*3/uL (ref 0.1–1.0)
Monocytes Relative: 6 %
Neutro Abs: 4 10*3/uL (ref 1.7–7.7)
Neutrophils Relative %: 46 %
PLATELETS: 177 10*3/uL (ref 150–400)
RBC: 5.25 MIL/uL (ref 4.22–5.81)
RDW: 12.8 % (ref 11.5–15.5)
WBC: 8.5 10*3/uL (ref 4.0–10.5)

## 2018-02-26 LAB — LIPID PANEL
CHOL/HDL RATIO: 4.4 ratio
Cholesterol: 160 mg/dL (ref 0–200)
HDL: 36 mg/dL — AB (ref >40)
LDL CALC: 91 mg/dL (ref 0–99)
Triglycerides: 164 mg/dL — ABNORMAL HIGH (ref <150)
VLDL: 33 mg/dL (ref 0–40)

## 2018-02-26 LAB — BASIC METABOLIC PANEL
Anion gap: 8 (ref 5–15)
BUN: 9 mg/dL (ref 6–20)
CALCIUM: 8.5 mg/dL — AB (ref 8.9–10.3)
CO2: 31 mmol/L (ref 22–32)
Chloride: 103 mmol/L (ref 101–111)
Creatinine, Ser: 1 mg/dL (ref 0.61–1.24)
GFR calc Af Amer: >60 mL/min (ref >60)
Glucose, Bld: 83 mg/dL (ref 65–99)
POTASSIUM: 4.2 mmol/L (ref 3.5–5.1)
Sodium: 142 mmol/L (ref 135–145)

## 2018-02-26 LAB — TROPONIN I
TROPONIN I: 4.58 ng/mL — AB (ref <0.03)
TROPONIN I: 3.77 ng/mL — AB (ref <0.03)
Troponin I: 2.95 ng/mL (ref <0.03)

## 2018-02-26 LAB — HEPARIN LEVEL (UNFRACTIONATED): Heparin Unfractionated: 0.22 IU/mL — ABNORMAL LOW (ref 0.30–0.70)

## 2018-02-26 LAB — ECHOCARDIOGRAM COMPLETE
HEIGHTINCHES: 71 in
Weight: 4248 oz

## 2018-02-26 LAB — HIV ANTIBODY (ROUTINE TESTING W REFLEX): HIV SCREEN 4TH GENERATION: NONREACTIVE

## 2018-02-26 MED ORDER — ALPRAZOLAM 0.5 MG PO TABS
0.5000 mg | ORAL_TABLET | Freq: Two times a day (BID) | ORAL | Status: DC | PRN
  Administered 2018-02-27 – 2018-03-01 (×6): 0.5 mg via ORAL
  Filled 2018-02-26 (×7): qty 1

## 2018-02-26 MED ORDER — ASPIRIN EC 81 MG PO TBEC
81.0000 mg | DELAYED_RELEASE_TABLET | Freq: Every day | ORAL | Status: DC
  Administered 2018-02-26 – 2018-02-27 (×2): 81 mg via ORAL
  Filled 2018-02-26 (×2): qty 1

## 2018-02-26 MED ORDER — HYDROCODONE-ACETAMINOPHEN 5-325 MG PO TABS
1.0000 | ORAL_TABLET | Freq: Four times a day (QID) | ORAL | Status: DC | PRN
  Administered 2018-02-26 – 2018-03-01 (×10): 2 via ORAL
  Filled 2018-02-26 (×10): qty 2

## 2018-02-26 MED ORDER — HYDROCODONE-ACETAMINOPHEN 10-325 MG PO TABS
1.0000 | ORAL_TABLET | Freq: Three times a day (TID) | ORAL | Status: DC
  Administered 2018-02-26: 1 via ORAL
  Filled 2018-02-26: qty 1

## 2018-02-26 MED ORDER — HEPARIN BOLUS VIA INFUSION
1000.0000 [IU] | Freq: Once | INTRAVENOUS | Status: AC
  Administered 2018-02-26: 1000 [IU] via INTRAVENOUS
  Filled 2018-02-26: qty 1000

## 2018-02-26 MED ORDER — HYDROCODONE-ACETAMINOPHEN 5-325 MG PO TABS: 1.0000 | ORAL_TABLET | Freq: Every day | ORAL | Status: DC | PRN

## 2018-02-26 MED ORDER — HEPARIN (PORCINE) IN NACL 100-0.45 UNIT/ML-% IJ SOLN
1500.0000 [IU]/h | INTRAMUSCULAR | Status: DC
  Administered 2018-02-26: 1350 [IU]/h via INTRAVENOUS
  Administered 2018-02-27: 1500 [IU]/h via INTRAVENOUS
  Filled 2018-02-26 (×3): qty 250

## 2018-02-26 NOTE — Progress Notes (Signed)
CRITICAL VALUE ALERT  Critical Value:  Troponin 2.95  Date & Time Notied:  02/26/2018   Provider Notified: Dr. Benjamine Mola MD on the unit   Orders Received/Actions taken: No new orders

## 2018-02-26 NOTE — Consult Note (Addendum)
Admit date: 02/25/2018 Referring Physician  Dr. Benjamine Mola Primary Physician  [facility] Primary Cardiologist  none Reason for Consultation  Chest pain  HPI: Joshua Rojas is a 52 y.o. male who is being seen today for the evaluation of chest pain at the request of Dr. Benjamine Mola  This is a 52 year old male with a history of CAD status post PCI 2011 and remote MI 1997, hypertension, anxiety/depression who awakened acutely in the middle the night with right substernal chest tightness and 9/10.  He says the pain radiated into his jaw and right arm.  He thought it was related to gas but the symptoms persisted and then he started having diaphoresis along with belching and nausea.  He has no history of heart failure but is gained 35 pounds in the past few months.  Said his symptoms were similar to his MI that he had a few years ago.  He called EMS and was given 2 baby aspirin and 1 SL NTG.  Cardiology is now asked to evaluate.    PMH:   Past Medical History:  Diagnosis Date  . Coronary artery disease    s/p remote 1997 and subsequent PCI  . H/O heart artery stent   . Hypertension   . Interstitial cystitis   . MI, old      PSH:   Past Surgical History:  Procedure Laterality Date  . CARDIAC CATHETERIZATION    . CHOLECYSTECTOMY    . CORONARY ANGIOPLASTY      Allergies:  Benadryl [diphenhydramine hcl]; Sudafed [pseudoephedrine]; and Triprolidine-pse Prior to Admit Meds:   Medications Prior to Admission  Medication Sig Dispense Refill Last Dose  . ALPRAZolam (XANAX) 1 MG tablet Take 0.5-2 mg by mouth See admin instructions. Take 0.5 mg by mouth two times a day and 2 mg at bedtime   02/25/2018 at am  . aspirin EC 81 MG tablet Take 81 mg by mouth daily.    02/25/2018 at 1500  . azelastine (ASTELIN) 0.1 % nasal spray Place 2 sprays into both nostrils daily as needed for rhinitis or allergies.   2 Past Month at Unknown time  . citalopram (CELEXA) 40 MG tablet Take 40 mg by mouth at  bedtime.    02/24/2018 at pm  . fluticasone (FLONASE) 50 MCG/ACT nasal spray Place 2 sprays into both nostrils daily as needed for allergies or rhinitis.   2 Past Month at Unknown time  . gabapentin (NEURONTIN) 600 MG tablet Take 600 mg by mouth 3 (three) times daily.   02/25/2018 at am  . HYDROcodone-acetaminophen (NORCO) 10-325 MG per tablet Take 0.5-1 tablets by mouth See admin instructions. Take 1 tablet by mouth three times a day and may take an additional 0.5-1 tablet daily as needed   02/25/2018 at am  . hydrOXYzine (ATARAX/VISTARIL) 25 MG tablet Take 25 mg by mouth at bedtime.    02/24/2018 at pm  . metoprolol succinate (TOPROL-XL) 100 MG 24 hr tablet Take 100 mg by mouth daily. Take with or immediately following a meal.    02/25/2018 at 1500  . omeprazole (PRILOSEC) 20 MG capsule Take 20 mg by mouth daily with breakfast.   02/25/2018 at Unknown time  . simvastatin (ZOCOR) 20 MG tablet Take 20 mg by mouth at bedtime.    02/24/2018 at pm  . sodium chloride (OCEAN) 0.65 % nasal spray Place 1 spray into the nose 3 (three) times daily as needed for congestion.    Past Week at Unknown  time  . Tamsulosin HCl (FLOMAX) 0.4 MG CAPS Take 0.4 mg by mouth at bedtime.    02/24/2018 at pm   Fam HX:    Family History  Problem Relation Age of Onset  . Diabetes Father    Social HX:    Social History   Socioeconomic History  . Marital status: Single    Spouse name: Not on file  . Number of children: Not on file  . Years of education: Not on file  . Highest education level: Not on file  Occupational History  . Not on file  Social Needs  . Financial resource strain: Not on file  . Food insecurity:    Worry: Not on file    Inability: Not on file  . Transportation needs:    Medical: Not on file    Non-medical: Not on file  Tobacco Use  . Smoking status: Current Every Day Smoker    Packs/day: 1.00    Types: Cigarettes  . Smokeless tobacco: Never Used  Substance and Sexual Activity  . Alcohol use:  No  . Drug use: No  . Sexual activity: Not Currently  Lifestyle  . Physical activity:    Days per week: Not on file    Minutes per session: Not on file  . Stress: Not on file  Relationships  . Social connections:    Talks on phone: Not on file    Gets together: Not on file    Attends religious service: Not on file    Active member of club or organization: Not on file    Attends meetings of clubs or organizations: Not on file    Relationship status: Not on file  . Intimate partner violence:    Fear of current or ex partner: Not on file    Emotionally abused: Not on file    Physically abused: Not on file    Forced sexual activity: Not on file  Other Topics Concern  . Not on file  Social History Narrative  . Not on file     ROS:  All  ROS were addressed and are negative except what is stated in the HPI  Physical Exam: Blood pressure 120/79, pulse (!) 50, temperature 98.2 F (36.8 C), temperature source Oral, resp. rate 18, height  (1.803 m), weight 265 lb 8 oz (120.4 kg), SpO2 95 %.    General: Well developed, well nourished, in no acute distress Head: Eyes PERRLA, No xanthomas.   Normal cephalic and atramatic  Lungs:   Clear bilaterally to auscultation and percussion. Heart:   HRRR S1 S2 Pulses are 2+ & equal.            No carotid bruit. No JVD.  No abdominal bruits. No femoral bruits. Abdomen: Bowel sounds are positive, abdomen soft and non-tender without masses or                  Hernia's noted. Msk:  Back normal, normal gait. Normal strength and tone for age. Extremities:   No clubbing, cyanosis or edema.  DP +1 Neuro: Alert and oriented X 3. Psych:  Good affect, responds appropriately    Labs:   Lab Results  Component Value Date   WBC 8.5 02/26/2018   HGB 16.5 02/26/2018   HCT 48.9 02/26/2018   MCV 93.1 02/26/2018   PLT 177 02/26/2018    Recent Labs  Lab 02/25/18 1804 02/26/18 0558  NA 140 142  K 3.6 4.2  CL 107 103  CO2 20* 31  BUN 8 9    CREATININE 1.00 1.00  CALCIUM 8.5* 8.5*  PROT 6.6  --   BILITOT 0.8  --   ALKPHOS 69  --   ALT 31  --   AST 29  --   GLUCOSE 186* 83   No results found for: PTT No results found for: INR, PROTIME Lab Results  Component Value Date   TROPONINI 2.95 (HH) 02/26/2018     Lab Results  Component Value Date   CHOL 160 02/26/2018   Lab Results  Component Value Date   HDL 36 (L) 02/26/2018   Lab Results  Component Value Date   LDLCALC 91 02/26/2018   Lab Results  Component Value Date   TRIG 164 (H) 02/26/2018   Lab Results  Component Value Date   CHOLHDL 4.4 02/26/2018   No results found for: LDLDIRECT    Radiology:  Dg Chest 2 View  Result Date: 02/25/2018 CLINICAL DATA:  Chest pain EXAM: CHEST - 2 VIEW COMPARISON:  11/24/2013 FINDINGS: The heart size and mediastinal contours are within normal limits allowing for AP technique. Both lungs are clear. The visualized skeletal structures are unremarkable. IMPRESSION: No active cardiopulmonary disease. Electronically Signed   By: Deatra Robinson M.D.   On: 02/25/2018 19:28     Telemetry    NSR - Personally Reviewed  ECG    NSR with IRBBB - Personally Reviewed   ASSESSMENT/PLAN:   1. NSTEMI- somewhat atypical and that it was right-sided radiation to the right arm.  It was associated with nausea and diaphoresis as well as shortness of breath though and Trop is elevated at 2.95.  EKG nonischemic.   -continue to cycle trop until it peaks -2D echo today to assess LVF -continue IV Heparin gtt and BB -change simvastatin to high dose Lipitor 80 mg daily. -Aspirin 81 mg daily -N.p.o. after midnight Sunday night -Plan left heart catheterization on Monday -Cardiac catheterization was discussed with the patient fully. The patient understands that risks include but are not limited to stroke (1 in 1000), death (1 in 1000), kidney failure [usually temporary] (1 in 500), bleeding (1 in 200), allergic reaction [possibly serious] (1 in  200).  The patient understands and is willing to proceed.     2.  CAD with history of remote MI 1997 and subsequent PCI 2011 -Continue beta-blocker, ASA and change to high-dose statin  3.  Hyperlipidemia with LDL goal less than 70. -LDL today 91 -Stopping simvastatin and starting Lipitor 80 mg daily -he will need an FLP and ALT in 6 weeks -HbA1C  4.  Hypertension -BP well controlled on Toprol-XL 100 mg daily Armanda Magic, MD  02/26/2018  1:40 PM

## 2018-02-26 NOTE — Progress Notes (Signed)
ANTICOAGULATION CONSULT NOTE - Initial Consult  Pharmacy Consult for heparin Indication: chest pain/ACS  Allergies  Allergen Reactions  . Benadryl [Diphenhydramine Hcl] Other (See Comments)    Makes interstitial cystitis worse  . Sudafed [Pseudoephedrine] Other (See Comments)    Makes interstitial cystitis worse  . Triprolidine-Pse Other (See Comments)    Makes interstitial cystitis worse    Patient Measurements: Height:  (180.3 cm) Weight: 265 lb 8 oz (120.4 kg)(scale C) IBW/kg (Calculated) : 75.3 Heparin Dosing Weight: 102 kg   Vital Signs: Temp: 98.3 F (36.8 C) (04/27 0546) Temp Source: Oral (04/27 0546) BP: 108/72 (04/27 0546) Pulse Rate: 50 (04/27 0546)  Labs: Recent Labs    02/25/18 1804 02/26/18 0558 02/26/18 0832  HGB 16.5 16.5  --   HCT 47.2 48.9  --   PLT 176 177  --   CREATININE 1.00 1.00  --   TROPONINI  --   --  2.95*    Estimated Creatinine Clearance: 116.6 mL/min (by C-G formula based on SCr of 1 mg/dL).   Medical History: Past Medical History:  Diagnosis Date  . H/O heart artery stent     x 1  . Hypertension   . Interstitial cystitis   . MI, old     Medications:  Scheduled:  . ALPRAZolam  2 mg Oral QHS  . citalopram  40 mg Oral QHS  . gabapentin  600 mg Oral TID  . HYDROcodone-acetaminophen  0.5-1 tablet Oral See admin instructions  . hydrOXYzine  25 mg Oral QHS  . metoprolol succinate  100 mg Oral Daily  . pantoprazole  40 mg Oral QHS  . simvastatin  20 mg Oral QHS  . tamsulosin  0.4 mg Oral QHS    Assessment: 52 yom who presented with R sided chest pain - not on anticoagulation PTA. Troponin increased from 0.04 to 2.95. Received enoxaparin for DVT ppx - last dose was on 4/27 on 0817.   Hgb 16.5, plts 177. No signs/symptoms of bleeding.   Goal of Therapy:  Heparin level 0.3-0.7 units/ml Monitor platelets by anticoagulation protocol: Yes   Plan:  Start heparin infusion at 1350 units/hr Check anti-Xa level in 6 hours  and daily while on heparin Continue to monitor H&H and platelets  Girard Cooter, PharmD Clinical Pharmacist  Pager: [phone] Clinical Phone for 02/26/2018 until 3:30pm: [phone] If after 3:30pm, please call main pharmacy at [phone] 02/26/2018,11:03 AM

## 2018-02-26 NOTE — Progress Notes (Signed)
ANTICOAGULATION CONSULT NOTE - Follow Up Consult  Pharmacy Consult for heparin Indication: chest pain/ACS  Allergies  Allergen Reactions  . Benadryl [Diphenhydramine Hcl] Other (See Comments)    Makes interstitial cystitis worse  . Sudafed [Pseudoephedrine] Other (See Comments)    Makes interstitial cystitis worse  . Triprolidine-Pse Other (See Comments)    Makes interstitial cystitis worse    Patient Measurements: Height:  (180.3 cm) Weight: 265 lb 8 oz (120.4 kg)(scale C) IBW/kg (Calculated) : 75.3 Heparin Dosing Weight: 102 kg   Vital Signs: Temp: 98.2 F (36.8 C) (04/27 1151) Temp Source: Oral (04/27 1151) BP: 120/79 (04/27 1151) Pulse Rate: 50 (04/27 1151)  Labs: Recent Labs    02/25/18 1804 02/26/18 0558 02/26/18 0832 02/26/18 1558 02/26/18 1704  HGB 16.5 16.5  --   --   --   HCT 47.2 48.9  --   --   --   PLT 176 177  --   --   --   HEPARINUNFRC  --   --   --   --  0.22*  CREATININE 1.00 1.00  --   --   --   TROPONINI  --   --  2.95* 4.58*  --     Estimated Creatinine Clearance: 116.6 mL/min (by C-G formula based on SCr of 1 mg/dL).   Medical History: Past Medical History:  Diagnosis Date  . Coronary artery disease    s/p remote 1997 and subsequent PCI  . H/O heart artery stent   . Hypertension   . Interstitial cystitis   . MI, old     Medications:  Scheduled:  . ALPRAZolam  2 mg Oral QHS  . aspirin EC  81 mg Oral Daily  . citalopram  40 mg Oral QHS  . gabapentin  600 mg Oral TID  . hydrOXYzine  25 mg Oral QHS  . metoprolol succinate  100 mg Oral Daily  . pantoprazole  40 mg Oral QHS  . simvastatin  20 mg Oral QHS  . tamsulosin  0.4 mg Oral QHS    Assessment: 52 yom who presented with R sided chest pain - not on anticoagulation PTA. Troponin increased from 0.04 to 2.95. Received enoxaparin for DVT ppx - last dose was on 4/27 on 0817.   Initial heparin level is subtherapeutic at 0.22.   Goal of Therapy:  Heparin level 0.3-0.7  units/ml Monitor platelets by anticoagulation protocol: Yes   Plan:  Give heparin bolus of 1000 units IV once  Increase heparin infusion to 1500 units/hr Check anti-Xa level in 6 hours and daily while on heparin Continue to monitor H&H and platelets  Vinnie Level, PharmD., BCPS Clinical Pharmacist If after 3:30pm, please call main pharmacy at: [phone]

## 2018-02-26 NOTE — Progress Notes (Signed)
MD made aware of change in troponin. No complaints of chest pain at this time continue with hep drip and cardiac plan.

## 2018-02-26 NOTE — Progress Notes (Signed)
  Echocardiogram 2D Echocardiogram has been performed.  Delcie Roch 02/26/2018, 2:16 PM

## 2018-02-26 NOTE — Progress Notes (Signed)
PROGRESS NOTE    Joshua Rojas  WUJ:[id number] DOB: 29-Feb-1966 DOA: 02/25/2018 PCP: [facility]   Outpatient Specialists:     Brief Narrative:  Joshua Rojas is a 52 y.o. male with medical history significant of HTN, CAD s/p PCI in 2011, MI in 1997, interstitial cystitis, anxiety/depression; who presents with complaints of right-sided chest pain.  Patient reports being acutely woken out of his sleep with right  substernal chest tightness that he rated as a 9 out of 10 on the pain scale.  The pain radiated to his jaw and right arm.  Initially thought symptoms may be related to gas, but when they persisted became more concerned.  Associated symptoms included diaphoresis, belching, nausea, hard to take a deep inspiratory breath, and reports weight gain of 35 pounds in the last few months.  She denies any fever, recent travel, leg swelling, calf pain, or vomiting.  He felt that his symptoms are similar to his previous heart attack that he had had years ago.  He took 2 baby aspirin and called EMS.  Upon EMS arrival patient was given 2 additional baby aspirin to chew and 1 nitroglycerin.  Patient notes being unable to tolerate nitroglycerin due to worsening his interstitial cystitis symptoms.     Assessment & Plan:   Principal Problem:   Chest pain Active Problems:   Interstitial cystitis   CAD (coronary artery disease)   Hyperlipidemia   GERD (gastroesophageal reflux disease)   Essential hypertension   NSTEMI - tele - CE not done overnight-- this AM was 2.95 -heparin gtt - LDL: 91-- on a statin -cardiology consult -NPO until cardiology plan in place-- ? Cath?  Essential hypertension - Continue metoprolol  CAD s/p PCI: Patient gives a previous history of MI 82 and stent placement in 2011 (in [place]).  Patient does not follow with a cardiologist.   Hyperlipidemia - Continue simvastatin  Anxiety/depression - Continue Celexa    Interstitial cystitis - Continue gabapentin, hydrocodone prn, and Xanax prn   GERD - Continue pharmacy substitution of Protonix     DVT prophylaxis:  Lovenox   Code Status: Full Code   Family Communication:   Disposition Plan:     Consultants:   cards    Subjective: In bed, no current chest pain-- had cath in early 30s  Objective: Vitals:   02/25/18 2200 02/25/18 2317 02/26/18 0546 02/26/18 1151  BP: (!) 124/58 135/81 108/72 120/79  Pulse: (!) 51 (!) 52 (!) 50 (!) 50  Resp: Temp:  98.9 F (37.2 C) 98.3 F (36.8 C) 98.2 F (36.8 C)  TempSrc:  Oral Oral Oral  SpO2: 95% 97% 96% 95%  Weight:  120.4 kg (265 lb 8 oz)    Height:   (1.803 m)      Intake/Output Summary (Last 24 hours) at 02/26/2018 1202 Last data filed at 02/26/2018 0900 Gross per 24 hour  Intake 120 ml  Output -  Net 120 ml   Filed Weights   02/25/18 1757 02/25/18 2317  Weight: 122.5 kg (270 lb) 120.4 kg (265 lb 8 oz)    Examination:  General exam: Appears calm and comfortable  Respiratory system: Clear to auscultation. Respiratory effort normal. Cardiovascular system: S1 & S2 heard, RRR. No JVD, murmurs, rubs, gallops or clicks. No pedal edema. Gastrointestinal system: Abdomen is nondistended, soft and nontender. No organomegaly or masses felt. Normal bowel sounds heard. Central nervous system: Alert and oriented. No focal neurological deficits.  Extremities: Symmetric 5 x 5 power. Skin: tatoos Psychiatry: Judgement and insight appear normal. Mood & affect appropriate.     Data Reviewed: I have personally reviewed following labs and imaging studies  CBC: Recent Labs  Lab 02/25/18 1804 02/26/18 0558  WBC 10.4 8.5  NEUTROABS  --  4.0  HGB 16.5 16.5  HCT 47.2 48.9  MCV 91.3 93.1  PLT 176 177   Basic Metabolic Panel: Recent Labs  Lab 02/25/18 1804 02/26/18 0558  NA 140 142  K 3.6 4.2  CL 107 103  CO2 20* 31  GLUCOSE 186* 83  BUN 8 9   CREATININE 1.00 1.00  CALCIUM 8.5* 8.5*   GFR: Estimated Creatinine Clearance: 116.6 mL/min (by C-G formula based on SCr of 1 mg/dL). Liver Function Tests: Recent Labs  Lab 02/25/18 1804  AST 29  ALT 31  ALKPHOS 69  BILITOT 0.8  PROT 6.6  ALBUMIN 3.9   Recent Labs  Lab 02/25/18 1804  LIPASE 43   No results for input(s): AMMONIA in the last 168 hours. Coagulation Profile: No results for input(s): INR, PROTIME in the last 168 hours. Cardiac Enzymes: Recent Labs  Lab 02/26/18 0832  TROPONINI 2.95*   BNP (last 3 results) No results for input(s): PROBNP in the last 8760 hours. HbA1C: No results for input(s): HGBA1C in the last 72 hours. CBG: No results for input(s): GLUCAP in the last 168 hours. Lipid Profile: Recent Labs    02/26/18 0558  CHOL 160  HDL 36*  LDLCALC 91  TRIG 161*  CHOLHDL 4.4   Thyroid Function Tests: Recent Labs    02/25/18 2141  TSH 0.572   Anemia Panel: No results for input(s): VITAMINB12, FOLATE, FERRITIN, TIBC, IRON, RETICCTPCT in the last 72 hours. Urine analysis: No results found for: COLORURINE, APPEARANCEUR, LABSPEC, PHURINE, GLUCOSEU, HGBUR, BILIRUBINUR, KETONESUR, PROTEINUR, UROBILINOGEN, NITRITE, LEUKOCYTESUR   )No results found for this or any previous visit (from the past 240 hour(s)).    Anti-infectives (From admission, onward)   None       Radiology Studies: Dg Chest 2 View  Result Date: 02/25/2018 CLINICAL DATA:  Chest pain EXAM: CHEST - 2 VIEW COMPARISON:  11/24/2013 FINDINGS: The heart size and mediastinal contours are within normal limits allowing for AP technique. Both lungs are clear. The visualized skeletal structures are unremarkable. IMPRESSION: No active cardiopulmonary disease. Electronically Signed   By: Deatra Robinson M.D.   On: 02/25/2018 19:28        Scheduled Meds: . ALPRAZolam  2 mg Oral QHS  . citalopram  40 mg Oral QHS  . gabapentin  600 mg Oral TID  . HYDROcodone-acetaminophen  0.5-1  tablet Oral See admin instructions  . hydrOXYzine  25 mg Oral QHS  . metoprolol succinate  100 mg Oral Daily  . pantoprazole  40 mg Oral QHS  . simvastatin  20 mg Oral QHS  . tamsulosin  0.4 mg Oral QHS   Continuous Infusions: . heparin       LOS: 0 days    Time spent: 35 min    Joseph Art, DO [facility] Pager [phone]  If 7PM-7AM, please contact night-coverage [email or web address] Password [facility] 02/26/2018, 12:02 PM

## 2018-02-26 NOTE — Progress Notes (Signed)
CRITICAL VALUE ALERT  Critical Value:  Tropin 2.95  Date & Time Notied:  02/26/18 1053  Provider Notified: Dr. Benjamine Mola  Orders Received/Actions taken:

## 2018-02-27 DIAGNOSIS — I214 Non-ST elevation (NSTEMI) myocardial infarction: Secondary | ICD-10-CM | POA: Diagnosis present

## 2018-02-27 DIAGNOSIS — R001 Bradycardia, unspecified: Secondary | ICD-10-CM | POA: Diagnosis not present

## 2018-02-27 DIAGNOSIS — I2510 Atherosclerotic heart disease of native coronary artery without angina pectoris: Secondary | ICD-10-CM | POA: Diagnosis present

## 2018-02-27 DIAGNOSIS — E669 Obesity, unspecified: Secondary | ICD-10-CM | POA: Diagnosis present

## 2018-02-27 DIAGNOSIS — I252 Old myocardial infarction: Secondary | ICD-10-CM | POA: Diagnosis not present

## 2018-02-27 DIAGNOSIS — F17210 Nicotine dependence, cigarettes, uncomplicated: Secondary | ICD-10-CM | POA: Diagnosis present

## 2018-02-27 DIAGNOSIS — I10 Essential (primary) hypertension: Secondary | ICD-10-CM | POA: Diagnosis present

## 2018-02-27 DIAGNOSIS — N3010 Interstitial cystitis (chronic) without hematuria: Secondary | ICD-10-CM | POA: Diagnosis present

## 2018-02-27 DIAGNOSIS — F329 Major depressive disorder, single episode, unspecified: Secondary | ICD-10-CM | POA: Diagnosis present

## 2018-02-27 DIAGNOSIS — F419 Anxiety disorder, unspecified: Secondary | ICD-10-CM

## 2018-02-27 DIAGNOSIS — Z6836 Body mass index (BMI) 36.0-36.9, adult: Secondary | ICD-10-CM

## 2018-02-27 DIAGNOSIS — Z7982 Long term (current) use of aspirin: Secondary | ICD-10-CM | POA: Diagnosis not present

## 2018-02-27 DIAGNOSIS — R072 Precordial pain: Secondary | ICD-10-CM | POA: Diagnosis present

## 2018-02-27 DIAGNOSIS — T50905A Adverse effect of unspecified drugs, medicaments and biological substances, initial encounter: Secondary | ICD-10-CM | POA: Diagnosis not present

## 2018-02-27 DIAGNOSIS — K219 Gastro-esophageal reflux disease without esophagitis: Secondary | ICD-10-CM

## 2018-02-27 DIAGNOSIS — Z79899 Other long term (current) drug therapy: Secondary | ICD-10-CM | POA: Diagnosis not present

## 2018-02-27 DIAGNOSIS — F32A Depression, unspecified: Secondary | ICD-10-CM | POA: Diagnosis present

## 2018-02-27 DIAGNOSIS — E785 Hyperlipidemia, unspecified: Secondary | ICD-10-CM | POA: Diagnosis present

## 2018-02-27 DIAGNOSIS — Z955 Presence of coronary angioplasty implant and graft: Secondary | ICD-10-CM | POA: Diagnosis not present

## 2018-02-27 DIAGNOSIS — Z888 Allergy status to other drugs, medicaments and biological substances status: Secondary | ICD-10-CM | POA: Diagnosis not present

## 2018-02-27 LAB — CBC
HEMATOCRIT: 48 % (ref 39.0–52.0)
Hemoglobin: 16.4 g/dL (ref 13.0–17.0)
MCH: 31.7 pg (ref 26.0–34.0)
MCHC: 34.2 g/dL (ref 30.0–36.0)
MCV: 92.7 fL (ref 78.0–100.0)
Platelets: 159 10*3/uL (ref 150–400)
RBC: 5.18 MIL/uL (ref 4.22–5.81)
RDW: 12.7 % (ref 11.5–15.5)
WBC: 6.6 10*3/uL (ref 4.0–10.5)

## 2018-02-27 LAB — HEPARIN LEVEL (UNFRACTIONATED)
Heparin Unfractionated: 0.37 IU/mL (ref 0.30–0.70)
Heparin Unfractionated: 0.37 IU/mL (ref 0.30–0.70)

## 2018-02-27 LAB — HEMOGLOBIN A1C
Hgb A1c MFr Bld: 5.4 % (ref 4.8–5.6)
MEAN PLASMA GLUCOSE: 108.28 mg/dL

## 2018-02-27 MED ORDER — SODIUM CHLORIDE 0.9% FLUSH: 3.0000 mL | INTRAVENOUS | Status: DC | PRN

## 2018-02-27 MED ORDER — METOPROLOL SUCCINATE ER 50 MG PO TB24
50.0000 mg | ORAL_TABLET | Freq: Every day | ORAL | Status: DC
  Administered 2018-02-28 – 2018-03-01 (×2): 50 mg via ORAL
  Filled 2018-02-27 (×2): qty 1

## 2018-02-27 MED ORDER — SODIUM CHLORIDE 0.9 % IV SOLN: 250.0000 mL | INTRAVENOUS | Status: DC | PRN

## 2018-02-27 MED ORDER — SODIUM CHLORIDE 0.9 % WEIGHT BASED INFUSION
3.0000 mL/kg/h | INTRAVENOUS | Status: DC
  Administered 2018-02-28: 3 mL/kg/h via INTRAVENOUS

## 2018-02-27 MED ORDER — AMIODARONE HCL IN DEXTROSE 360-4.14 MG/200ML-% IV SOLN
INTRAVENOUS | Status: AC
  Filled 2018-02-27: qty 200

## 2018-02-27 MED ORDER — SODIUM CHLORIDE 0.9% FLUSH: 3.0000 mL | Freq: Two times a day (BID) | INTRAVENOUS | Status: DC

## 2018-02-27 MED ORDER — SODIUM CHLORIDE 0.9 % WEIGHT BASED INFUSION: 1.0000 mL/kg/h | INTRAVENOUS | Status: DC

## 2018-02-27 MED ORDER — SIMVASTATIN 40 MG PO TABS: 40.0000 mg | ORAL_TABLET | Freq: Every day | ORAL | Status: DC

## 2018-02-27 MED ORDER — ASPIRIN 81 MG PO CHEW
81.0000 mg | CHEWABLE_TABLET | ORAL | Status: AC
  Administered 2018-02-28: 81 mg via ORAL
  Filled 2018-02-27: qty 1

## 2018-02-27 MED ORDER — ATORVASTATIN CALCIUM 80 MG PO TABS
80.0000 mg | ORAL_TABLET | Freq: Every day | ORAL | Status: DC
  Administered 2018-02-27: 80 mg via ORAL
  Filled 2018-02-27: qty 1

## 2018-02-27 NOTE — Progress Notes (Signed)
Progress Note    [name]  JXB:[id number] DOB: [birth year]  DOA: 02/25/2018 PCP: [facility]    Brief Narrative:   Chief complaint: Chest pain.  Medical records reviewed and are as summarized below:  Joshua Rojas is an 52 y.o. male with a PMH of hypertension, CAD status post PCI in 2011, MI in 1997, interstitial cystitis, anxiety/depression who was admitted 02/25/18 for evaluation of right-sided chest pain associated with a 35 pound weight gain. Troponins subsequently elevated and patient diagnosed with a non-STEMI. Placed on a heparin drip and cardiology consulted.  Assessment/Plan:   Principal Problem:   Non-ST elevation (NSTEMI) myocardial infarction (HCC)/chest pain in a patient with known coronary artery disease Troponin elevation to a maximum value of 4.58 consistent with non-STEMI. Initial EKG negative for ischemic changes. 2-D echo showed an EF of 60-65 percent with no regional wall motion abnormalities. Cardiology plans to perform a cardiac catheterization on 02/28/18. Continue heparin, aspirin, statin, metoprolol and morphine as needed.  Active Problems:   Interstitial cystitis Continue Flomax.    Hyperlipidemia Continue statin. LDL is 91. Increase Zocor to achieve goal LDL less than 70.    GERD (gastroesophageal reflux disease) Continue Protonix.    Essential hypertension Controlled on metoprolol.    Depression/anxiety Continue Celexa and as needed Xanax.    Obesity with comorbidities Body mass index is 36.88 kg/m.   Family Communication/Anticipated D/C date and plan/Code Status   DVT prophylaxis: Heparin ordered. Code Status: Full Code.  Family Communication: No family at the bedside. Disposition Plan: Possibly home 4/29 if cath clean.   Medical Consultants:    Cardiology   Anti-Infectives:    None  Subjective:   No current chest pain. Has bladder discomfort related to interstitial cystitis as well as  urinary frequency. No hematuria.  Objective:    Vitals:   02/26/18 0546 02/26/18 1151 02/26/18 2110 02/27/18 0604  BP: 108/72 120/79 121/77 110/74  Pulse: (!) 50 (!) 50 (!) 51 (!) 56  Resp: Temp: 98.3 F (36.8 C) 98.2 F (36.8 C) 97.9 F (36.6 C) (!) 97.5 F (36.4 C)  TempSrc: Oral Oral Oral Oral  SpO2: 96% 95% 94% 96%  Weight:    119.9 kg (264 lb 6.4 oz)  Height:        Intake/Output Summary (Last 24 hours) at 02/27/2018 0724 Last data filed at 02/26/2018 1846 Gross per 24 hour  Intake 385.65 ml  Output -  Net 385.65 ml   Filed Weights   02/25/18 1757 02/25/18 2317 02/27/18 0604  Weight: 122.5 kg (270 lb) 120.4 kg (265 lb 8 oz) 119.9 kg (264 lb 6.4 oz)    Exam: General: No acute distress. Cardiovascular: Heart sounds show a regular rate, and rhythm. No gallops or rubs. No murmurs. No JVD. Lungs: Clear to auscultation bilaterally with good air movement. No rales, rhonchi or wheezes. Abdomen: Soft, tender in suprapubic area, nondistended with normal active bowel sounds. No masses. No hepatosplenomegaly. Neurological: Alert and oriented 3. Moves all extremities 4 with equal strength. Cranial nerves II through XII grossly intact. Skin: Warm and dry. No rashes or lesions. Extremities: No clubbing or cyanosis. No edema. Pedal pulses 2+. Psychiatric: Mood and affect are normal. Insight and judgment are normal.   Data Reviewed:   I have personally reviewed following labs and imaging studies:  Labs: Labs show the following:   Basic Metabolic Panel: Recent Labs  Lab 02/25/18 1804 02/26/18 0558  NA 140 142  K 3.6 4.2  CL 107 103  CO2 20* 31  GLUCOSE 186* 83  BUN 8 9  CREATININE 1.00 1.00  CALCIUM 8.5* 8.5*   GFR Estimated Creatinine Clearance: 116.4 mL/min (by C-G formula based on SCr of 1 mg/dL). Liver Function Tests: Recent Labs  Lab 02/25/18 1804  AST 29  ALT 31  ALKPHOS 69  BILITOT 0.8  PROT 6.6  ALBUMIN 3.9   Recent Labs  Lab  02/25/18 1804  LIPASE 43   CBC: Recent Labs  Lab 02/25/18 1804 02/26/18 0558  WBC 10.4 8.5  NEUTROABS  --  4.0  HGB 16.5 16.5  HCT 47.2 48.9  MCV 91.3 93.1  PLT 176 177   Cardiac Enzymes: Recent Labs  Lab 02/26/18 0832 02/26/18 1558 02/26/18 2026  TROPONINI 2.95* 4.58* 3.77*   D-Dimer: Recent Labs    02/25/18 03-04-2127  DDIMER 0.27   Lipid Profile: Recent Labs    02/26/18 0558  CHOL 160  HDL 36*  LDLCALC 91  TRIG 284*  CHOLHDL 4.4   Thyroid function studies: Recent Labs    02/25/18 2140/03/03  TSH 0.572    Microbiology No results found for this or any previous visit (from the past 240 hour(s)).  Procedures and diagnostic studies:  Dg Chest 2 View  Result Date: 02/25/2018 CLINICAL DATA:  Chest pain EXAM: CHEST - 2 VIEW COMPARISON:  11/24/2013 FINDINGS: The heart size and mediastinal contours are within normal limits allowing for AP technique. Both lungs are clear. The visualized skeletal structures are unremarkable. IMPRESSION: No active cardiopulmonary disease. Electronically Signed   By: Deatra Robinson M.D.   On: 02/25/2018 19:28    Medications:   . ALPRAZolam  2 mg Oral QHS  . aspirin EC  81 mg Oral Daily  . citalopram  40 mg Oral QHS  . gabapentin  600 mg Oral TID  . hydrOXYzine  25 mg Oral QHS  . metoprolol succinate  100 mg Oral Daily  . pantoprazole  40 mg Oral QHS  . simvastatin  20 mg Oral QHS  . tamsulosin  0.4 mg Oral QHS   Continuous Infusions: . heparin 1,500 Units/hr (02/27/18 0220)     LOS: 0 days   Hillery Aldo  [facility] Pager [phone]. If unable to reach me by pager, please call my cell phone at [phone].  *Please refer to [email or web address], password [email or web address] to get updated schedule on who will round on this patient, as hospitalists switch teams weekly. If 7PM-7AM, please contact night-coverage at [email or web address], password [email or web address] for any overnight needs.  02/27/2018, 7:24 AM

## 2018-02-27 NOTE — Progress Notes (Signed)
ANTICOAGULATION CONSULT NOTE - Follow Up Consult  Pharmacy Consult for Heparin Indication: chest pain/ACS  Allergies  Allergen Reactions  . Benadryl [Diphenhydramine Hcl] Other (See Comments)    Makes interstitial cystitis worse  . Sudafed [Pseudoephedrine] Other (See Comments)    Makes interstitial cystitis worse  . Triprolidine-Pse Other (See Comments)    Makes interstitial cystitis worse    Patient Measurements: Height:  (180.3 cm) Weight: 264 lb 6.4 oz (119.9 kg) IBW/kg (Calculated) : 75.3 Heparin Dosing Weight: 102 kg   Vital Signs: Temp: 97.5 F (36.4 C) (04/28 0604) Temp Source: Oral (04/28 0604) BP: 110/74 (04/28 0604) Pulse Rate: 56 (04/28 0604)  Labs: Recent Labs    02/25/18 1804 02/26/18 0558 02/26/18 2956 02/26/18 1558 02/26/18 1704 02/26/18 2026 02/27/18 0042 02/27/18 0914  HGB 16.5 16.5  --   --   --   --   --  16.4  HCT 47.2 48.9  --   --   --   --   --  48.0  PLT 176 177  --   --   --   --   --  159  HEPARINUNFRC  --   --   --   --  0.22*  --  0.37 0.37  CREATININE 1.00 1.00  --   --   --   --   --   --   TROPONINI  --   --  2.95* 4.58*  --  3.77*  --   --     Estimated Creatinine Clearance: 116.4 mL/min (by C-G formula based on SCr of 1 mg/dL).   Medical History: Past Medical History:  Diagnosis Date  . Coronary artery disease    s/p remote 1997 and subsequent PCI  . H/O heart artery stent   . Hypertension   . Interstitial cystitis   . MI, old     Medications:  Scheduled:  . ALPRAZolam  2 mg Oral QHS  . aspirin EC  81 mg Oral Daily  . citalopram  40 mg Oral QHS  . gabapentin  600 mg Oral TID  . hydrOXYzine  25 mg Oral QHS  . metoprolol succinate  100 mg Oral Daily  . pantoprazole  40 mg Oral QHS  . simvastatin  40 mg Oral QHS  . tamsulosin  0.4 mg Oral QHS    Assessment: 52 yom who presented with R sided chest pain - not on anticoagulation PTA. Troponin increased from 0.04 to 2.95. Received enoxaparin for DVT ppx - last  dose was on 4/27 on 0817.   Confirmatory heparin level this morning remained in therapeutic range 0.37, on 1500 units/hr. Hgb 16.5, plts 159. No signs/symptoms of bleeding. No infusion issues.  Goal of Therapy:  Heparin level 0.3-0.7 units/ml Monitor platelets by anticoagulation protocol: Yes   Plan:  Cont heparin at 1500 units/hr Monitor daily HL, CBC, and s/sx of bleeding F/u with plans for cardiac cath  Girard Cooter, PharmD Clinical Pharmacist  Pager: [phone] Clinical Phone for 02/27/2018 until 3:30pm: [phone] If after 3:30pm, please call main pharmacy at [phone]

## 2018-02-27 NOTE — Progress Notes (Signed)
Pt family express concern for diet  Pt family states that he is vegan and cannot eat the meatloaf that was delivered  Explained how to call and order meals, provided menu.  Informed primary RN

## 2018-02-27 NOTE — H&P (View-Only) (Signed)
Progress Note  Patient Name: Joshua Rojas Date of Encounter: 02/27/2018  Primary Cardiologist: No primary care provider on file.   Subjective   Denies any chest pain or shortness of breath  Inpatient Medications    Scheduled Meds: . ALPRAZolam  2 mg Oral QHS  . aspirin EC  81 mg Oral Daily  . citalopram  40 mg Oral QHS  . gabapentin  600 mg Oral TID  . hydrOXYzine  25 mg Oral QHS  . metoprolol succinate  100 mg Oral Daily  . pantoprazole  40 mg Oral QHS  . simvastatin  40 mg Oral QHS  . tamsulosin  0.4 mg Oral QHS   Continuous Infusions: . amiodarone    . heparin 1,500 Units/hr (02/27/18 0220)   PRN Meds: acetaminophen, ALPRAZolam, azelastine, fluticasone, gi cocktail, HYDROcodone-acetaminophen, morphine injection, ondansetron (ZOFRAN) IV   Vital Signs    Vitals:   02/26/18 0546 02/26/18 1151 02/26/18 2110 02/27/18 0604  BP: 108/72 120/79 121/77 110/74  Pulse: (!) 50 (!) 50 (!) 51 (!) 56  Resp: Temp: 98.3 F (36.8 C) 98.2 F (36.8 C) 97.9 F (36.6 C) (!) 97.5 F (36.4 C)  TempSrc: Oral Oral Oral Oral  SpO2: 96% 95% 94% 96%  Weight:    264 lb 6.4 oz (119.9 kg)  Height:        Intake/Output Summary (Last 24 hours) at 02/27/2018 1023 Last data filed at 02/27/2018 0958 Gross per 24 hour  Intake 505.65 ml  Output -  Net 505.65 ml   Filed Weights   02/25/18 1757 02/25/18 2317 02/27/18 0604  Weight: 270 lb (122.5 kg) 265 lb 8 oz (120.4 kg) 264 lb 6.4 oz (119.9 kg)    Telemetry    Normal sinus rhythm- Personally Reviewed  ECG    No EKG to review- Personally Reviewed  Physical Exam   GEN: No acute distress.   Neck: No JVD Cardiac: RRR, no murmurs, rubs, or gallops.  Respiratory: Clear to auscultation bilaterally. GI: Soft, nontender, non-distended  MS: No edema; No deformity. Neuro:  Nonfocal  Psych: Normal affect   Labs    Chemistry Recent Labs  Lab 02/25/18 1804 02/26/18 0558  NA 140 142  K 3.6 4.2  CL 107 103  CO2 20* 31   GLUCOSE 186* 83  BUN 8 9  CREATININE 1.00 1.00  CALCIUM 8.5* 8.5*  PROT 6.6  --   ALBUMIN 3.9  --   AST 29  --   ALT 31  --   ALKPHOS 69  --   BILITOT 0.8  --   GFRNONAA >60 >60  GFRAA >60 >60  ANIONGAP 13 8     Hematology Recent Labs  Lab 02/25/18 1804 02/26/18 0558 02/27/18 0914  WBC 10.4 8.5 6.6  RBC 5.17 5.25 5.18  HGB 16.5 16.5 16.4  HCT 47.2 48.9 48.0  MCV 91.3 93.1 92.7  MCH 31.9 31.4 31.7  MCHC 35.0 33.7 34.2  RDW 12.4 12.8 12.7  PLT 176 177 159    Cardiac Enzymes Recent Labs  Lab 02/26/18 0832 02/26/18 1558 02/26/18 2026  TROPONINI 2.95* 4.58* 3.77*    Recent Labs  Lab 02/25/18 1815 02/25/18 2034  TROPIPOC 0.02 0.04     BNP Recent Labs  Lab 02/25/18 2141  BNP 50.1     DDimer  Recent Labs  Lab 02/25/18 2128  DDIMER 0.27     Radiology    Dg Chest 2 View  Result Date: 02/25/2018 CLINICAL  DATA:  Chest pain EXAM: CHEST - 2 VIEW COMPARISON:  11/24/2013 FINDINGS: The heart size and mediastinal contours are within normal limits allowing for AP technique. Both lungs are clear. The visualized skeletal structures are unremarkable. IMPRESSION: No active cardiopulmonary disease. Electronically Signed   By: Deatra Robinson M.D.   On: 02/25/2018 19:28    Cardiac Studies  2D echo Study Conclusions  - Left ventricle: The cavity size was mildly dilated. Systolic   function was normal. The estimated ejection fraction was in the   range of 60% to 65%. Wall motion was normal; there were no   regional wall motion abnormalities. Left ventricular diastolic   function parameters were normal. - Mitral valve: There was trivial regurgitation. - Pulmonary arteries: Systolic pressure could not be accurately   estimated.  Patient Profile     52 y.o. male with a history of CAD status post PCI 2011 and remote MI 1997, hypertension, anxiety/depression who awakened acutely in the middle the night with right substernal chest tightness and 9/10.  He says the  pain radiated into his jaw and right arm.  He thought it was related to gas but the symptoms persisted and then he started having diaphoresis along with belching and nausea.  He has no history of heart failure but is gained 35 pounds in the past few months.  Said his symptoms were similar to his MI that he had a few years ago  Assessment & Plan    1. NSTEMI- somewhat atypical in that it was right-sided radiation to the right arm.  It was associated with nausea and diaphoresis as well as shortness of breath though and Trop is elevated at 2.95.  EKG nonischemic.   -Troponin peaked at 4.58 and is trending downward -2D echo showed normal LV systolic function with no regional wall motion normalities -continue IV Heparin gtt, ASA and BB -Simvastatin was changed to high-dose Lipitor 80 mg daily -He is n.p.o. after midnight tomorrow night for left heart catheterization on Monday with possible PCI -Cardiac catheterization was discussed with the patient fully. The patient understands that risks include but are not limited to stroke (1 in 1000), death (1 in 1000), kidney failure [usually temporary] (1 in 500), bleeding (1 in 200), allergic reaction [possibly serious] (1 in 200).  The patient understands and is willing to proceed.    2.  CAD with history of remote MI 1997 and subsequent PCI 2011 -Continue beta-blocker, ASA and change to high-dose statin -Is not sure where his PCI was done but he thinks it was somewhere in [place]  3.  Hyperlipidemia with LDL goal less than 70. -LDL today 91 -Statin changed to high-dose Lipitor 80 mg daily -he will need an FLP and ALT in 6 weeks -HbA1C  4.  Hypertension -BP is well controlled on exam today at 110/74 mmHg -Due to bradycardia I am going to drop his Toprol to 50 mg daily -Will need to follow blood pressure and may need additional blood pressure agent added if blood pressure trends upward on lower dose of beta-blocker  5.  Drug-induced  bradycardia -See above - will decrease Toprol to 50 mg daily    For questions or updates, please contact [facility] Please consult [email or web address] for contact info under Cardiology/STEMI.      Signed, Armanda Magic, MD  02/27/2018, 10:23 AM

## 2018-02-27 NOTE — Progress Notes (Signed)
 Progress Note  Patient Name: Joshua Rojas Date of Encounter: 02/27/2018  Primary Cardiologist: No primary care provider on file.   Subjective   Denies any chest pain or shortness of breath  Inpatient Medications    Scheduled Meds: . ALPRAZolam  2 mg Oral QHS  . aspirin EC  81 mg Oral Daily  . citalopram  40 mg Oral QHS  . gabapentin  600 mg Oral TID  . hydrOXYzine  25 mg Oral QHS  . metoprolol succinate  100 mg Oral Daily  . pantoprazole  40 mg Oral QHS  . simvastatin  40 mg Oral QHS  . tamsulosin  0.4 mg Oral QHS   Continuous Infusions: . amiodarone    . heparin 1,500 Units/hr (02/27/18 0220)   PRN Meds: acetaminophen, ALPRAZolam, azelastine, fluticasone, gi cocktail, HYDROcodone-acetaminophen, morphine injection, ondansetron (ZOFRAN) IV   Vital Signs    Vitals:   02/26/18 0546 02/26/18 1151 02/26/18 2110 02/27/18 0604  BP: 108/72 120/79 121/77 110/74  Pulse: (!) 50 (!) 50 (!) 51 (!) 56  Resp: 16 18 20 20  Temp: 98.3 F (36.8 C) 98.2 F (36.8 C) 97.9 F (36.6 C) (!) 97.5 F (36.4 C)  TempSrc: Oral Oral Oral Oral  SpO2: 96% 95% 94% 96%  Weight:    264 lb 6.4 oz (119.9 kg)  Height:        Intake/Output Summary (Last 24 hours) at 02/27/2018 1023 Last data filed at 02/27/2018 0958 Gross per 24 hour  Intake 505.65 ml  Output -  Net 505.65 ml   Filed Weights   02/25/18 1757 02/25/18 2317 02/27/18 0604  Weight: 270 lb (122.5 kg) 265 lb 8 oz (120.4 kg) 264 lb 6.4 oz (119.9 kg)    Telemetry    Normal sinus rhythm- Personally Reviewed  ECG    No EKG to review- Personally Reviewed  Physical Exam   GEN: No acute distress.   Neck: No JVD Cardiac: RRR, no murmurs, rubs, or gallops.  Respiratory: Clear to auscultation bilaterally. GI: Soft, nontender, non-distended  MS: No edema; No deformity. Neuro:  Nonfocal  Psych: Normal affect   Labs    Chemistry Recent Labs  Lab 02/25/18 1804 02/26/18 0558  NA 140 142  K 3.6 4.2  CL 107 103  CO2 20* 31   GLUCOSE 186* 83  BUN 8 9  CREATININE 1.00 1.00  CALCIUM 8.5* 8.5*  PROT 6.6  --   ALBUMIN 3.9  --   AST 29  --   ALT 31  --   ALKPHOS 69  --   BILITOT 0.8  --   GFRNONAA >60 >60  GFRAA >60 >60  ANIONGAP 13 8     Hematology Recent Labs  Lab 02/25/18 1804 02/26/18 0558 02/27/18 0914  WBC 10.4 8.5 6.6  RBC 5.17 5.25 5.18  HGB 16.5 16.5 16.4  HCT 47.2 48.9 48.0  MCV 91.3 93.1 92.7  MCH 31.9 31.4 31.7  MCHC 35.0 33.7 34.2  RDW 12.4 12.8 12.7  PLT 176 177 159    Cardiac Enzymes Recent Labs  Lab 02/26/18 0832 02/26/18 1558 02/26/18 2026  TROPONINI 2.95* 4.58* 3.77*    Recent Labs  Lab 02/25/18 1815 02/25/18 2034  TROPIPOC 0.02 0.04     BNP Recent Labs  Lab 02/25/18 2141  BNP 50.1     DDimer  Recent Labs  Lab 02/25/18 2128  DDIMER 0.27     Radiology    Dg Chest 2 View  Result Date: 02/25/2018 CLINICAL   DATA:  Chest pain EXAM: CHEST - 2 VIEW COMPARISON:  11/24/2013 FINDINGS: The heart size and mediastinal contours are within normal limits allowing for AP technique. Both lungs are clear. The visualized skeletal structures are unremarkable. IMPRESSION: No active cardiopulmonary disease. Electronically Signed   By: Deatra Robinson M.D.   On: 02/25/2018 19:28    Cardiac Studies  2D echo Study Conclusions  - Left ventricle: The cavity size was mildly dilated. Systolic   function was normal. The estimated ejection fraction was in the   range of 60% to 65%. Wall motion was normal; there were no   regional wall motion abnormalities. Left ventricular diastolic   function parameters were normal. - Mitral valve: There was trivial regurgitation. - Pulmonary arteries: Systolic pressure could not be accurately   estimated.  Patient Profile     52 y.o. male with a history of CAD status post PCI 2011 and remote MI 1997, hypertension, anxiety/depression who awakened acutely in the middle the night with right substernal chest tightness and 9/10.  He says the  pain radiated into his jaw and right arm.  He thought it was related to gas but the symptoms persisted and then he started having diaphoresis along with belching and nausea.  He has no history of heart failure but is gained 35 pounds in the past few months.  Said his symptoms were similar to his MI that he had a few years ago  Assessment & Plan    1. NSTEMI- somewhat atypical in that it was right-sided radiation to the right arm.  It was associated with nausea and diaphoresis as well as shortness of breath though and Trop is elevated at 2.95.  EKG nonischemic.   -Troponin peaked at 4.58 and is trending downward -2D echo showed normal LV systolic function with no regional wall motion normalities -continue IV Heparin gtt, ASA and BB -Simvastatin was changed to high-dose Lipitor 80 mg daily -He is n.p.o. after midnight tomorrow night for left heart catheterization on Monday with possible PCI -Cardiac catheterization was discussed with the patient fully. The patient understands that risks include but are not limited to stroke (1 in 1000), death (1 in 1000), kidney failure [usually temporary] (1 in 500), bleeding (1 in 200), allergic reaction [possibly serious] (1 in 200).  The patient understands and is willing to proceed.    2.  CAD with history of remote MI 1997 and subsequent PCI 2011 -Continue beta-blocker, ASA and change to high-dose statin -Is not sure where his PCI was done but he thinks it was somewhere in [place]  3.  Hyperlipidemia with LDL goal less than 70. -LDL today 91 -Statin changed to high-dose Lipitor 80 mg daily -he will need an FLP and ALT in 6 weeks -HbA1C  4.  Hypertension -BP is well controlled on exam today at 110/74 mmHg -Due to bradycardia I am going to drop his Toprol to 50 mg daily -Will need to follow blood pressure and may need additional blood pressure agent added if blood pressure trends upward on lower dose of beta-blocker  5.  Drug-induced  bradycardia -See above - will decrease Toprol to 50 mg daily    For questions or updates, please contact [facility] Please consult [email or web address] for contact info under Cardiology/STEMI.      Signed, Armanda Magic, MD  02/27/2018, 10:23 AM

## 2018-02-27 NOTE — Progress Notes (Signed)
ANTICOAGULATION CONSULT NOTE - Follow Up Consult  Pharmacy Consult for Heparin Indication: chest pain/ACS  Allergies  Allergen Reactions  . Benadryl [Diphenhydramine Hcl] Other (See Comments)    Makes interstitial cystitis worse  . Sudafed [Pseudoephedrine] Other (See Comments)    Makes interstitial cystitis worse  . Triprolidine-Pse Other (See Comments)    Makes interstitial cystitis worse    Patient Measurements: Height:  (180.3 cm) Weight: 265 lb 8 oz (120.4 kg)(scale C) IBW/kg (Calculated) : 75.3 Heparin Dosing Weight: 102 kg   Vital Signs: Temp: 97.9 F (36.6 C) (04/27 2110) Temp Source: Oral (04/27 2110) BP: 121/77 (04/27 2110) Pulse Rate: 51 (04/27 2110)  Labs: Recent Labs    02/25/18 1804 02/26/18 0558 02/26/18 0832 02/26/18 1558 02/26/18 1704 02/26/18 2026 02/27/18 0042  HGB 16.5 16.5  --   --   --   --   --   HCT 47.2 48.9  --   --   --   --   --   PLT 176 177  --   --   --   --   --   HEPARINUNFRC  --   --   --   --  0.22*  --  0.37  CREATININE 1.00 1.00  --   --   --   --   --   TROPONINI  --   --  2.95* 4.58*  --  3.77*  --     Estimated Creatinine Clearance: 116.6 mL/min (by C-G formula based on SCr of 1 mg/dL).   Medical History: Past Medical History:  Diagnosis Date  . Coronary artery disease    s/p remote 1997 and subsequent PCI  . H/O heart artery stent   . Hypertension   . Interstitial cystitis   . MI, old     Medications:  Scheduled:  . ALPRAZolam  2 mg Oral QHS  . aspirin EC  81 mg Oral Daily  . citalopram  40 mg Oral QHS  . gabapentin  600 mg Oral TID  . hydrOXYzine  25 mg Oral QHS  . metoprolol succinate  100 mg Oral Daily  . pantoprazole  40 mg Oral QHS  . simvastatin  20 mg Oral QHS  . tamsulosin  0.4 mg Oral QHS    Assessment: 52 yom who presented with R sided chest pain - not on anticoagulation PTA. Troponin increased from 0.04 to 2.95. Received enoxaparin for DVT ppx - last dose was on 4/27 on 0817.   4/28  AM Update: troponin trending down (4.58>>3.77), heparin level therapeutic x 1 after rate increase  Goal of Therapy:  Heparin level 0.3-0.7 units/ml Monitor platelets by anticoagulation protocol: Yes   Plan:  Cont heparin at 1500 units/hr 0900 HL/CBC  Abran Duke, PharmD, BCPS Clinical Pharmacist Phone: [phone]

## 2018-02-28 ENCOUNTER — Encounter (HOSPITAL_COMMUNITY): Payer: Self-pay | Admitting: General Practice

## 2018-02-28 ENCOUNTER — Encounter (HOSPITAL_COMMUNITY)
Admission: EM | Disposition: A | Discharge status: Home / Self Care | Payer: Self-pay | Source: Home / Self Care | Attending: Internal Medicine

## 2018-02-28 HISTORY — PX: CORONARY STENT INTERVENTION: CATH118234

## 2018-02-28 HISTORY — PX: CARDIAC CATHETERIZATION: SHX172

## 2018-02-28 HISTORY — PX: LEFT HEART CATH AND CORONARY ANGIOGRAPHY: CATH118249

## 2018-02-28 LAB — HEPARIN LEVEL (UNFRACTIONATED): HEPARIN UNFRACTIONATED: 0.3 [IU]/mL (ref 0.30–0.70)

## 2018-02-28 LAB — CBC
HCT: 46.6 % (ref 39.0–52.0)
Hemoglobin: 16.3 g/dL (ref 13.0–17.0)
MCH: 32 pg (ref 26.0–34.0)
MCHC: 35 g/dL (ref 30.0–36.0)
MCV: 91.6 fL (ref 78.0–100.0)
PLATELETS: 161 10*3/uL (ref 150–400)
RBC: 5.09 MIL/uL (ref 4.22–5.81)
RDW: 12.6 % (ref 11.5–15.5)
WBC: 6.8 10*3/uL (ref 4.0–10.5)

## 2018-02-28 LAB — TROPONIN I
Troponin I: 1.9 ng/mL (ref <0.03)
Troponin I: 1.84 ng/mL (ref <0.03)

## 2018-02-28 LAB — BASIC METABOLIC PANEL
Anion gap: 8 (ref 5–15)
BUN: 13 mg/dL (ref 6–20)
CALCIUM: 8.7 mg/dL — AB (ref 8.9–10.3)
CO2: 26 mmol/L (ref 22–32)
Chloride: 105 mmol/L (ref 101–111)
Creatinine, Ser: 1.11 mg/dL (ref 0.61–1.24)
GLUCOSE: 87 mg/dL (ref 65–99)
Potassium: 3.5 mmol/L (ref 3.5–5.1)
SODIUM: 139 mmol/L (ref 135–145)

## 2018-02-28 LAB — PROTIME-INR
INR: 1.01
PROTHROMBIN TIME: 13.2 s (ref 11.4–15.2)

## 2018-02-28 LAB — POCT ACTIVATED CLOTTING TIME: Activated Clotting Time: 879 seconds

## 2018-02-28 SURGERY — LEFT HEART CATH AND CORONARY ANGIOGRAPHY
Anesthesia: LOCAL

## 2018-02-28 MED ORDER — TICAGRELOR 90 MG PO TABS
ORAL_TABLET | ORAL | Status: AC
  Filled 2018-02-28: qty 1

## 2018-02-28 MED ORDER — GI COCKTAIL ~~LOC~~
30.0000 mL | Freq: Once | ORAL | Status: AC
  Administered 2018-02-28: 15:00:00 30 mL via ORAL
  Filled 2018-02-28: qty 30

## 2018-02-28 MED ORDER — DIAZEPAM 5 MG PO TABS: 5.0000 mg | ORAL_TABLET | Freq: Four times a day (QID) | ORAL | Status: DC | PRN

## 2018-02-28 MED ORDER — NITROGLYCERIN IN D5W 200-5 MCG/ML-% IV SOLN
INTRAVENOUS | Status: AC | PRN
  Administered 2018-02-28: 10 ug/min via INTRAVENOUS

## 2018-02-28 MED ORDER — NITROGLYCERIN 1 MG/10 ML FOR IR/CATH LAB
INTRA_ARTERIAL | Status: DC | PRN
  Administered 2018-02-28 (×3): 200 ug via INTRACORONARY

## 2018-02-28 MED ORDER — BIVALIRUDIN TRIFLUOROACETATE 250 MG IV SOLR
INTRAVENOUS | Status: AC
  Filled 2018-02-28: qty 250

## 2018-02-28 MED ORDER — ATORVASTATIN CALCIUM 80 MG PO TABS: 80.0000 mg | ORAL_TABLET | Freq: Every day | ORAL | Status: DC

## 2018-02-28 MED ORDER — SODIUM CHLORIDE 0.9 % IV SOLN: 250.0000 mL | INTRAVENOUS | Status: DC | PRN

## 2018-02-28 MED ORDER — FENTANYL CITRATE (PF) 100 MCG/2ML IJ SOLN
INTRAMUSCULAR | Status: AC
  Filled 2018-02-28: qty 2

## 2018-02-28 MED ORDER — ANGIOPLASTY BOOK
Freq: Once | Status: AC
  Administered 2018-02-28: 22:00:00
  Filled 2018-02-28: qty 1

## 2018-02-28 MED ORDER — HEPARIN SODIUM (PORCINE) 1000 UNIT/ML IJ SOLN
INTRAMUSCULAR | Status: AC
  Filled 2018-02-28: qty 1

## 2018-02-28 MED ORDER — HEPARIN (PORCINE) IN NACL 2-0.9 UNITS/ML
INTRAMUSCULAR | Status: AC | PRN
  Administered 2018-02-28 (×2): 500 mL via INTRA_ARTERIAL

## 2018-02-28 MED ORDER — ASPIRIN 81 MG PO CHEW
81.0000 mg | CHEWABLE_TABLET | Freq: Every day | ORAL | Status: DC
  Administered 2018-03-01: 81 mg via ORAL
  Filled 2018-02-28: qty 1

## 2018-02-28 MED ORDER — HEPARIN (PORCINE) IN NACL 1000-0.9 UT/500ML-% IV SOLN
INTRAVENOUS | Status: AC
  Filled 2018-02-28: qty 1000

## 2018-02-28 MED ORDER — TICAGRELOR 90 MG PO TABS
ORAL_TABLET | ORAL | Status: DC | PRN
  Administered 2018-02-28: 180 mg via ORAL

## 2018-02-28 MED ORDER — IOHEXOL 350 MG/ML SOLN
INTRAVENOUS | Status: DC | PRN
  Administered 2018-02-28: 170 mL via INTRA_ARTERIAL

## 2018-02-28 MED ORDER — HEART ATTACK BOUNCING BOOK
Freq: Once | Status: AC
  Administered 2018-02-28: 22:00:00
  Filled 2018-02-28 (×2): qty 1

## 2018-02-28 MED ORDER — SALINE SPRAY 0.65 % NA SOLN
1.0000 | NASAL | Status: DC | PRN
  Administered 2018-02-28: 1 via NASAL
  Filled 2018-02-28: qty 44

## 2018-02-28 MED ORDER — MIDAZOLAM HCL 2 MG/2ML IJ SOLN
INTRAMUSCULAR | Status: AC
  Filled 2018-02-28: qty 2

## 2018-02-28 MED ORDER — ACETAMINOPHEN 325 MG PO TABS
650.0000 mg | ORAL_TABLET | ORAL | Status: DC | PRN
  Administered 2018-03-01 (×2): 650 mg via ORAL
  Filled 2018-02-28 (×2): qty 2

## 2018-02-28 MED ORDER — NITROGLYCERIN IN D5W 200-5 MCG/ML-% IV SOLN
0.0000 ug/min | INTRAVENOUS | Status: DC
  Administered 2018-02-28: 10:00:00 10 ug/min via INTRAVENOUS

## 2018-02-28 MED ORDER — FENTANYL CITRATE (PF) 100 MCG/2ML IJ SOLN
INTRAMUSCULAR | Status: AC
Start: 2018-02-28 — End: 2018-02-28
  Filled 2018-02-28: qty 2

## 2018-02-28 MED ORDER — LIDOCAINE HCL (PF) 1 % IJ SOLN
INTRAMUSCULAR | Status: DC | PRN
  Administered 2018-02-28: 2 mL

## 2018-02-28 MED ORDER — HEPARIN SODIUM (PORCINE) 1000 UNIT/ML IJ SOLN
INTRAMUSCULAR | Status: DC | PRN
  Administered 2018-02-28: 6000 [IU] via INTRAVENOUS

## 2018-02-28 MED ORDER — LABETALOL HCL 5 MG/ML IV SOLN: 10.0000 mg | INTRAVENOUS | Status: DC | PRN

## 2018-02-28 MED ORDER — SODIUM CHLORIDE 0.9 % IV SOLN
1.7500 mg/kg/h | INTRAVENOUS | Status: DC
  Filled 2018-02-28: qty 250

## 2018-02-28 MED ORDER — SODIUM CHLORIDE 0.9% FLUSH
3.0000 mL | Freq: Two times a day (BID) | INTRAVENOUS | Status: DC
  Administered 2018-02-28 – 2018-03-01 (×2): 3 mL via INTRAVENOUS

## 2018-02-28 MED ORDER — SODIUM CHLORIDE 0.9% FLUSH: 3.0000 mL | INTRAVENOUS | Status: DC | PRN

## 2018-02-28 MED ORDER — THE SENSUOUS HEART BOOK
Freq: Once | Status: AC
  Administered 2018-02-28: 22:00:00
  Filled 2018-02-28: qty 1

## 2018-02-28 MED ORDER — SODIUM CHLORIDE 0.9 % IV SOLN
INTRAVENOUS | Status: DC
  Administered 2018-02-28: 12:00:00 via INTRAVENOUS

## 2018-02-28 MED ORDER — FENTANYL CITRATE (PF) 100 MCG/2ML IJ SOLN
INTRAMUSCULAR | Status: DC | PRN
  Administered 2018-02-28 (×3): 25 ug via INTRAVENOUS
  Administered 2018-02-28: 50 ug via INTRAVENOUS

## 2018-02-28 MED ORDER — BIVALIRUDIN BOLUS VIA INFUSION - CUPID
INTRAVENOUS | Status: DC | PRN
  Administered 2018-02-28: 89.25 mg via INTRAVENOUS

## 2018-02-28 MED ORDER — VERAPAMIL HCL 2.5 MG/ML IV SOLN
INTRAVENOUS | Status: AC
  Filled 2018-02-28: qty 2

## 2018-02-28 MED ORDER — NITROGLYCERIN IN D5W 200-5 MCG/ML-% IV SOLN
INTRAVENOUS | Status: AC
  Filled 2018-02-28: qty 250

## 2018-02-28 MED ORDER — ONDANSETRON HCL 4 MG/2ML IJ SOLN
4.0000 mg | Freq: Four times a day (QID) | INTRAMUSCULAR | Status: DC | PRN
  Filled 2018-02-28: qty 2

## 2018-02-28 MED ORDER — MIDAZOLAM HCL 2 MG/2ML IJ SOLN
INTRAMUSCULAR | Status: DC | PRN
  Administered 2018-02-28 (×2): 2 mg via INTRAVENOUS

## 2018-02-28 MED ORDER — SODIUM CHLORIDE 0.9 % IV SOLN
INTRAVENOUS | Status: AC | PRN
  Administered 2018-02-28 (×2): 1.75 mg/kg/h via INTRAVENOUS

## 2018-02-28 MED ORDER — ATORVASTATIN CALCIUM 80 MG PO TABS
80.0000 mg | ORAL_TABLET | Freq: Every day | ORAL | Status: DC
  Administered 2018-02-28: 80 mg via ORAL
  Filled 2018-02-28: qty 1

## 2018-02-28 MED ORDER — VERAPAMIL HCL 2.5 MG/ML IV SOLN
INTRAVENOUS | Status: DC | PRN
  Administered 2018-02-28: 08:00:00 via INTRA_ARTERIAL

## 2018-02-28 MED ORDER — HYDRALAZINE HCL 20 MG/ML IJ SOLN: 5.0000 mg | INTRAMUSCULAR | Status: DC | PRN

## 2018-02-28 MED ORDER — HYDROMORPHONE HCL 1 MG/ML IJ SOLN
1.0000 mg | INTRAMUSCULAR | Status: DC | PRN
  Administered 2018-02-28 (×2): 1 mg via INTRAVENOUS
  Filled 2018-02-28 (×3): qty 1

## 2018-02-28 MED ORDER — TICAGRELOR 90 MG PO TABS
90.0000 mg | ORAL_TABLET | Freq: Two times a day (BID) | ORAL | Status: DC
  Administered 2018-02-28 – 2018-03-01 (×2): 90 mg via ORAL
  Filled 2018-02-28 (×2): qty 1

## 2018-02-28 MED ORDER — LIDOCAINE HCL (PF) 1 % IJ SOLN
INTRAMUSCULAR | Status: AC
  Filled 2018-02-28: qty 30

## 2018-02-28 SURGICAL SUPPLY — 24 items
BALLN EUPHORA RX 2.5X12 (BALLOONS) ×2
BALLN WOLVERINE 2.00X10 (BALLOONS) ×2
BALLN ~~LOC~~ EUPHORA RX 3.75X12 (BALLOONS) ×2
BALLOON EUPHORA RX 2.5X12 (BALLOONS) ×1 IMPLANT
BALLOON WOLVERINE 2.00X10 (BALLOONS) ×1 IMPLANT
BALLOON ~~LOC~~ EUPHORA RX 3.75X12 (BALLOONS) ×1 IMPLANT
CATH INFINITI 5 FR JL3.5 (CATHETERS) ×2 IMPLANT
CATH INFINITI 5FR JL4 (CATHETERS) ×2 IMPLANT
CATH LAUNCHER 5F RADL (CATHETERS) ×1 IMPLANT
CATH OPTITORQUE TIG 4.0 5F (CATHETERS) ×2 IMPLANT
CATHETER LAUNCHER 5F RADL (CATHETERS) ×2
COVER PRB 48X5XTLSCP FOLD TPE (BAG) ×1 IMPLANT
COVER PROBE 5X48 (BAG) ×1
DEVICE RAD COMP TR BAND LRG (VASCULAR PRODUCTS) ×2 IMPLANT
GLIDESHEATH SLEND SS 6F .021 (SHEATH) ×2 IMPLANT
GUIDEWIRE INQWIRE 1.5J.035X260 (WIRE) ×1 IMPLANT
INQWIRE 1.5J .035X260CM (WIRE) ×2
KIT ENCORE 26 ADVANTAGE (KITS) ×2 IMPLANT
KIT HEART LEFT (KITS) ×2 IMPLANT
PACK CARDIAC CATHETERIZATION (CUSTOM PROCEDURE TRAY) ×2 IMPLANT
STENT RESOLUTE ONYX 3.5X15 (Permanent Stent) ×2 IMPLANT
TRANSDUCER W/STOPCOCK (MISCELLANEOUS) ×2 IMPLANT
TUBING CIL FLEX 10 FLL-RA (TUBING) ×2 IMPLANT
WIRE MARVEL STR TIP 190CM (WIRE) ×2 IMPLANT

## 2018-02-28 NOTE — Progress Notes (Signed)
Requested Dr. Excell Seltzer to see patient before leaving today because of continued chest pain during the shift. Troponin scheduled to be drawn around 1930. All ECG's and troponins have showed no changes of concern. During shift discussing information with patient and patients mother regarding the ecg and lab results to ensure their understanding of the plan of care has been. Patient and mother verbalized understanding and appreciation of reassurance and explanations during shift as well as emotional support given. Patient appreciated Dr. Excell Seltzer coming into explain and reassure him of measures taken to care for him during the day. FU troponin drawn at 1837 was resulted and trending down at 1.84. Reported off to [facility].

## 2018-02-28 NOTE — Care Management Note (Signed)
Case Management Note  Patient Details  Name: Joshua Rojas MRN: [id number] Date of Birth: 08-17-1966  Subjective/Objective:     From home, s/p coronary stent intervention, will be on brilinta which is new for patient, he has medicaid, he states his co pay is 1.95,  Velva Harman will given patient the 30 day savings free coupon.                 Action/Plan: DC home when ready.  Expected Discharge Date:  02/26/18               Expected Discharge Plan:  Home/Self Care  In-House Referral:     Discharge planning Services  CM Consult, Medication Assistance  Post Acute Care Choice:    Choice offered to:     DME Arranged:    DME Agency:     HH Arranged:    HH Agency:     Status of Service:  Completed, signed off  If discussed at [employer] of Stay Meetings, dates discussed:    Additional Comments:  Leone Haven, RN 02/28/2018, 3:29 PM

## 2018-02-28 NOTE — Progress Notes (Signed)
PROGRESS NOTE    Joshua Rojas  ZOX:[id number] DOB: 03/26/1966 DOA: 02/25/2018 PCP: [facility]     Brief Narrative:  Joshua Rojas is a 52 y.o. male with PMH of hypertension, CAD status post PCI in 2011, MI in 1997, interstitial cystitis, anxiety/depression who was admitted 02/25/18 for evaluation of right-sided chest pain associated with 35 pound weight gain.  He describes his chest pain is a right chest tightness that radiated up to his right jaw and down his right arm.  Troponins subsequently elevated and patient diagnosed with a non-STEMI.  He was started on heparin drip, cardiology consulted.  He underwent heart catheterization 4/29 with PCI.  Assessment & Plan:   Principal Problem:   Non-ST elevation (NSTEMI) myocardial infarction [facility]) Active Problems:   Chest pain   Interstitial cystitis   CAD (coronary artery disease)   Hyperlipidemia   GERD (gastroesophageal reflux disease)   Essential hypertension   Anxiety and depression   Obesity with comorbidities   NSTEMI  -Troponin peak at 4.58 consistent with NSTEMI. Initial EKG negative for ischemic changes. 2-D echo showed an EF of 60-65 percent with no regional wall motion abnormalities -Cardiology consulted -Heart cath 4/29 s/p PCI to LAD  -Nitro gtt, angiomax  -Continue aspirin/brilinta, lipitor, metoprolol and morphine as needed  Interstitial cystitis -Continue Flomax  GERD (gastroesophageal reflux disease) -Continue Protonix  Essential hypertension -Continue metoprolol  Depression/anxiety -Continue Celexa and as needed Xanax  Obesity with comorbidities -Body mass index is 36.88 kg/m.    DVT prophylaxis: Heparin gtt Code Status: Full  Family Communication: At bedside Disposition Plan: Pending improvement, cardiology follow up   Consultants:   Cardiology  Procedures:   Heart cath 4/29   Antimicrobials:  Anti-infectives (From admission, onward)   None        Subjective: Patient seen post heart cath.  He remains on nitro drip.  He complains of some headache, persistent right-sided chest pain that radiates up to his neck and down his arm.  Had some shortness of breath post heart cath.  Denies any nausea, vomiting, abdominal pain.  Objective: Vitals:   02/28/18 0901 02/28/18 0906 02/28/18 0927 02/28/18 1111  BP: 138/80 139/83 138/71 (!) 162/66  Pulse: 66 (!) 57 63 61  Resp: Temp:   97.6 F (36.4 C) 98.1 F (36.7 C)  TempSrc:   Oral Oral  SpO2: 96% 100% 93% 96%  Weight:      Height:        Intake/Output Summary (Last 24 hours) at 02/28/2018 1237 Last data filed at 02/28/2018 1010 Gross per 24 hour  Intake 78.3 ml  Output -  Net 78.3 ml   Filed Weights   02/25/18 2317 02/27/18 0604 02/28/18 0425  Weight: 120.4 kg (265 lb 8 oz) 119.9 kg (264 lb 6.4 oz) 119 kg (262 lb 6.4 oz)    Examination:  General exam: Appears calm and comfortable  Respiratory system: Clear to auscultation. Respiratory effort normal. Cardiovascular system: S1 & S2 heard, RRR. No JVD, murmurs, rubs, gallops or clicks. No pedal edema. Gastrointestinal system: Abdomen is nondistended, soft and nontender. No organomegaly or masses felt. Normal bowel sounds heard. Central nervous system: Alert and oriented. No focal neurological deficits. Extremities: Symmetric 5 x 5 power. Skin: No rashes, lesions or ulcers Psychiatry: Judgement and insight appear normal. Mood & affect appropriate.   Data Reviewed: I have personally reviewed following labs and imaging studies  CBC: Recent Labs  Lab 02/25/18 1804  02/26/18 0558 02/27/18 0914 02/28/18 0650  WBC 10.4 8.5 6.6 6.8  NEUTROABS  --  4.0  --   --   HGB 16.5 16.5 16.4 16.3  HCT 47.2 48.9 48.0 46.6  MCV 91.3 93.1 92.7 91.6  PLT 176 177 159 161   Basic Metabolic Panel: Recent Labs  Lab 02/25/18 1804 02/26/18 0558 02/28/18 0650  NA 140 142 139  K 3.6 4.2 3.5  CL 107 103 105  CO2 20* 31 26   GLUCOSE 186* 83 87  BUN CREATININE 1.00 1.00 1.11  CALCIUM 8.5* 8.5* 8.7*   GFR: Estimated Creatinine Clearance: 104.5 mL/min (by C-G formula based on SCr of 1.11 mg/dL). Liver Function Tests: Recent Labs  Lab 02/25/18 1804  AST 29  ALT 31  ALKPHOS 69  BILITOT 0.8  PROT 6.6  ALBUMIN 3.9   Recent Labs  Lab 02/25/18 1804  LIPASE 43   No results for input(s): AMMONIA in the last 168 hours. Coagulation Profile: Recent Labs  Lab 02/28/18 0650  INR 1.01   Cardiac Enzymes: Recent Labs  Lab 02/26/18 0832 02/26/18 1558 02/26/18 2026  TROPONINI 2.95* 4.58* 3.77*   BNP (last 3 results) No results for input(s): PROBNP in the last 8760 hours. HbA1C: Recent Labs    02/27/18 1032  HGBA1C 5.4   CBG: No results for input(s): GLUCAP in the last 168 hours. Lipid Profile: Recent Labs    02/26/18 0558  CHOL 160  HDL 36*  LDLCALC 91  TRIG 161*  CHOLHDL 4.4   Thyroid Function Tests: Recent Labs    02/25/18 2141  TSH 0.572   Anemia Panel: No results for input(s): VITAMINB12, FOLATE, FERRITIN, TIBC, IRON, RETICCTPCT in the last 72 hours. Sepsis Labs: No results for input(s): PROCALCITON, LATICACIDVEN in the last 168 hours.  No results found for this or any previous visit (from the past 240 hour(s)).     Radiology Studies: No results found.    Scheduled Meds: . ALPRAZolam  2 mg Oral QHS  . aspirin  81 mg Oral Daily  . atorvastatin  80 mg Oral q1800  . atorvastatin  80 mg Oral q1800  . citalopram  40 mg Oral QHS  . gabapentin  600 mg Oral TID  . hydrOXYzine  25 mg Oral QHS  . metoprolol succinate  50 mg Oral Daily  . pantoprazole  40 mg Oral QHS  . sodium chloride flush  3 mL Intravenous Q12H  . tamsulosin  0.4 mg Oral QHS  . ticagrelor  90 mg Oral BID   Continuous Infusions: . sodium chloride 150 mL/hr at 02/28/18 1147  . sodium chloride    . bivalirudin (ANGIOMAX) infusion 5 mg/mL (Cath Lab,ACS,PCI indication) Stopped (02/28/18 1010)  .  nitroGLYCERIN       LOS: 1 day    Time spent: 35 minutes   Noralee Stain, DO [facility] [email or web address] Password [facility] 02/28/2018, 12:37 PM

## 2018-02-28 NOTE — Progress Notes (Signed)
PM round: Still having chest pain but otherwise stable. Looks comfortable. Exam unchanged. Troponin trending down but due for FU troponin. EKG without acute change or ST elevation. Will FU in am unless clinical change.   Joshua Rojas 02/28/2018 6:48 PM

## 2018-02-28 NOTE — Progress Notes (Addendum)
1115 increased nitro infusion to 15 mcg/min for complaint of 7/10 right upper chest pain radiating to right neck and jaw and upper right shoulder, described as tightness. 1132 complains of radiating chest pain, tightness, 9.5/10. Increased nitro infusion to 20 mcg/min. 1141 obtained 12 lead ECG while patient described pain, tightness unchanged, remaining at 9.5/10 1145 Patient complained of 10/10 chest tightness radiating, increased nitro infusion to 25 mcg/min. Corine Shelter PA text paged and returned a call, patient given MS 2 mg IV as instructed. 1201 reported pain/tightness decreased to 8.5-9/10. 1210 Reported pain/tightness 8-8.5/10.  [address] Corine Shelter PA into see paitent. 1222 Patient rates pain/tightness radiating to right neck and jaw, right upper shoulder at 8-8.5/10, with no changed since 1210.

## 2018-02-28 NOTE — Progress Notes (Addendum)
RN called, pt c/o 9/10 SSCP. 1 pm Troponin trending down. EKG from 2pm shows no acute changes. Discussed with Dr Excell Seltzer. Will try GI cocktail and Dilaudid- stop MSO4.   Corine Shelter PA-C 02/28/2018 2:25 PM

## 2018-02-28 NOTE — Progress Notes (Signed)
Complained of chest pain/tightness 8.5/10 at 1310, increased infusion to 30 mcg/min. Stated headache was better.  1322 complained of chest pain/tightness radiating to right jaw, neck and upper right shoulder, 10/10 and shortness of breath. Denies nausea, no vomiting or diaphoresis.  1324 given IV morphine 2 mg for pain. 1340 patient reports no change with IV morphine, remains at 10/10 pain/tightness with shortness of breath.

## 2018-02-28 NOTE — Progress Notes (Signed)
Pt had chest pain post PCI and was placed on IV NTG in cath lab. Once in his room he complains of "9/10" SSCP- "just like when he was blowing up the balloon". He doesn't look like he is having that much pain. His EKG shows no acute changes. Dr Excell Seltzer present. Good result on LAD PCI, small Dx, CTO of OM. Not sure what is causing his pain but he has received Xanax, MSO4, Neurontin and beta blocker. Will follow Troponin post PCI.   Corine Shelter PA-C 02/28/2018 12:29 PM

## 2018-02-28 NOTE — Interval H&P Note (Signed)
Cath Lab Visit (complete for each Cath Lab visit)  Clinical Evaluation Leading to the Procedure:   ACS: No.  Non-ACS:    Anginal Classification: CCS IV  Anti-ischemic medical therapy: Minimal Therapy (1 class of medications)  Non-Invasive Test Results: No non-invasive testing performed  Prior CABG: No previous CABG      History and Physical Interval Note:  02/28/2018 7:32 AM  Joshua Rojas  has presented today for surgery, with the diagnosis of NSTEMI  The various methods of treatment have been discussed with the patient and family. After consideration of risks, benefits and other options for treatment, the patient has consented to  Procedure(s): LEFT HEART CATH AND CORONARY ANGIOGRAPHY (N/A) as a surgical intervention .  The patient's history has been reviewed, patient examined, no change in status, stable for surgery.  I have reviewed the patient's chart and labs.  Questions were answered to the patient's satisfaction.     Nicki Guadalajara

## 2018-02-28 NOTE — Progress Notes (Signed)
12 lead ecg obtained at 1358, patient complaining of 10/10 chest pain/tightness in right upper chest, radiating to right neck, jaw and right upper shoulder. Nitroglycerin infusion at 30 mcg/min. BP 146/72, Sinus Rhythm in 70's with no ectopy.

## 2018-02-28 NOTE — Progress Notes (Signed)
Progress Note  Patient Name: Joshua Rojas Date of Encounter: 02/28/2018  Primary Cardiologist: No primary care provider on file.   Subjective   Pt complains of chest pain. He is post-PCI from this morning.  He is now describing 8/10 chest pain that he felt during balloon inflations and is experiencing again now.  Inpatient Medications    Scheduled Meds: . ALPRAZolam  2 mg Oral QHS  . aspirin  81 mg Oral Daily  . atorvastatin  80 mg Oral q1800  . atorvastatin  80 mg Oral q1800  . citalopram  40 mg Oral QHS  . gabapentin  600 mg Oral TID  . hydrOXYzine  25 mg Oral QHS  . metoprolol succinate  50 mg Oral Daily  . pantoprazole  40 mg Oral QHS  . sodium chloride flush  3 mL Intravenous Q12H  . tamsulosin  0.4 mg Oral QHS  . ticagrelor  90 mg Oral BID   Continuous Infusions: . sodium chloride 150 mL/hr at 02/28/18 1147  . sodium chloride    . nitroGLYCERIN     PRN Meds: sodium chloride, acetaminophen, ALPRAZolam, azelastine, diazepam, fluticasone, gi cocktail, hydrALAZINE, HYDROcodone-acetaminophen, labetalol, morphine injection, ondansetron (ZOFRAN) IV, ondansetron (ZOFRAN) IV, sodium chloride flush   Vital Signs    Vitals:   02/28/18 0901 02/28/18 0906 02/28/18 0927 02/28/18 1111  BP: 138/80 139/83 138/71 (!) 162/66  Pulse: 66 (!) 57 63 61  Resp: Temp:   97.6 F (36.4 C) 98.1 F (36.7 C)  TempSrc:   Oral Oral  SpO2: 96% 100% 93% 96%  Weight:      Height:        Intake/Output Summary (Last 24 hours) at 02/28/2018 1307 Last data filed at 02/28/2018 1252 Gross per 24 hour  Intake 78.3 ml  Output 200 ml  Net -121.7 ml   Filed Weights   02/25/18 2317 02/27/18 0604 02/28/18 0425  Weight: 265 lb 8 oz (120.4 kg) 264 lb 6.4 oz (119.9 kg) 262 lb 6.4 oz (119 kg)    Telemetry    Sinus rhythm, sinus brady - no arrhythmia - Personally Reviewed  ECG    Normal sinus rhythm with T wave abnormality consider inferior ischemia, no significant change from  previous tracings- Personally Reviewed  Physical Exam  Alert, oriented male, obese, in no distress  GEN: No acute distress.   Neck: No JVD Cardiac: RRR, no murmurs, rubs, or gallops.  Respiratory: Clear to auscultation bilaterally. GI: Soft, nontender, non-distended  MS: No edema; No deformity. right radial site is clear. Neuro:  Nonfocal  Psych: Normal affect   Labs    Chemistry Recent Labs  Lab 02/25/18 1804 02/26/18 0558 02/28/18 0650  NA 140 142 139  K 3.6 4.2 3.5  CL 107 103 105  CO2 20* 31 26  GLUCOSE 186* 83 87  BUN CREATININE 1.00 1.00 1.11  CALCIUM 8.5* 8.5* 8.7*  PROT 6.6  --   --   ALBUMIN 3.9  --   --   AST 29  --   --   ALT 31  --   --   ALKPHOS 69  --   --   BILITOT 0.8  --   --   GFRNONAA >60 >60 >60  GFRAA >60 >60 >60  ANIONGAP Hematology Recent Labs  Lab 02/26/18 0558 02/27/18 0914 02/28/18 0650  WBC 8.5 6.6 6.8  RBC 5.25 5.18 5.09  HGB 16.5 16.4 16.3  HCT 48.9 48.0 46.6  MCV 93.1 92.7 91.6  MCH 31.4 31.7 32.0  MCHC 33.7 34.2 35.0  RDW 12.8 12.7 12.6  PLT 177 159 161    Cardiac Enzymes Recent Labs  Lab 02/26/18 0832 02/26/18 1558 02/26/18 2026  TROPONINI 2.95* 4.58* 3.77*    Recent Labs  Lab 02/25/18 1815 02/25/18 2034  TROPIPOC 0.02 0.04     BNP Recent Labs  Lab 02/25/18 2141  BNP 50.1     DDimer  Recent Labs  Lab 02/25/18 2128  DDIMER 0.27     Radiology    No results found.  Cardiac Studies   Cardiac Cath: Conclusion     Prox LAD lesion is 95% stenosed.  Ost 1st Diag lesion is 80% stenosed.  Ost 2nd Mrg to 2nd Mrg lesion is 100% stenosed.  Post intervention, there is a 0% residual stenosis.  A stent was successfully placed.  Ost Ramus lesion is 50% stenosed.   Multivessel CAD with 95% eccentric proximal LAD stenosis in the region of the first small diagonal takeoff with 80% ostial narrowing in the small diagonal vessel; 50% eccentric ostial stenosis in the ramus  intermediate vessel; probable chronic total occlusion of a previously placed circumflex marginal stent.  Normal RCA.  LVEDP 15 mmHg.  Successful PCI to the LAD with cutting balloon/PTCA, and ultimate DES stenting with a Resolute onyx 3.5 x 15 mm stent, postdilated to 3.73 mm with a 95% stenosis being reduced to 0%.  There is no change in the 80% ostial diagonal stenosis and a small caliber vessel which was not intervened upon.     Patient Profile     52 y.o. male with NSTEMI  Assessment & Plan    1.  Non-STEMI: Cath films from this morning are reviewed.  He underwent apparent uncomplicated PCI of the LAD with a drug-eluting stent.  The angiographic result is excellent and there is tight stenosis of a very small diagonal but this appears unchanged following stenting.  There is TIMI-3 flow in all major vessels.  The patient does have recurrent chest discomfort, but that I do not appreciate any changes in his EKG and he appears comfortable on examination.  I would be inclined to treat him with ongoing medical therapy, IV nitroglycerin, and morphine as needed for pain.  If his symptoms change, enzymes increased significantly, or EKG changes, he will require relook angiography.  All this is explained to him in detail today and he understands.  2.  Hyperlipidemia: Now on a high intensity statin drug with atorvastatin 80 mg daily.  3.  Hypertension: Continue metoprolol succinate.  Blood pressure is controlled.  For questions or updates, please contact [facility] Please consult [email or web address] for contact info under Cardiology/STEMI.      Signed, Tonny Bollman, MD  02/28/2018, 1:07 PM

## 2018-03-01 LAB — BASIC METABOLIC PANEL
ANION GAP: 12 (ref 5–15)
BUN: 9 mg/dL (ref 6–20)
CALCIUM: 8.8 mg/dL — AB (ref 8.9–10.3)
CO2: 23 mmol/L (ref 22–32)
Chloride: 104 mmol/L (ref 101–111)
Creatinine, Ser: 1.04 mg/dL (ref 0.61–1.24)
GLUCOSE: 107 mg/dL — AB (ref 65–99)
POTASSIUM: 3.9 mmol/L (ref 3.5–5.1)
SODIUM: 139 mmol/L (ref 135–145)

## 2018-03-01 LAB — TROPONIN I
TROPONIN I: 7.22 ng/mL — AB (ref <0.03)
Troponin I: 3.84 ng/mL (ref <0.03)

## 2018-03-01 LAB — CBC
HCT: 43 % (ref 39.0–52.0)
HEMOGLOBIN: 14.8 g/dL (ref 13.0–17.0)
MCH: 31.1 pg (ref 26.0–34.0)
MCHC: 34.4 g/dL (ref 30.0–36.0)
MCV: 90.3 fL (ref 78.0–100.0)
Platelets: 175 10*3/uL (ref 150–400)
RBC: 4.76 MIL/uL (ref 4.22–5.81)
RDW: 12.4 % (ref 11.5–15.5)
WBC: 8.5 10*3/uL (ref 4.0–10.5)

## 2018-03-01 MED ORDER — ATORVASTATIN CALCIUM 80 MG PO TABS: 80.0000 mg | ORAL_TABLET | Freq: Every day | ORAL | 0 refills | Status: DC

## 2018-03-01 MED ORDER — METOPROLOL SUCCINATE ER 50 MG PO TB24: 50.0000 mg | ORAL_TABLET | Freq: Every day | ORAL | 0 refills | Status: DC

## 2018-03-01 MED ORDER — ISOSORBIDE MONONITRATE ER 30 MG PO TB24
30.0000 mg | ORAL_TABLET | Freq: Every day | ORAL | Status: DC
  Administered 2018-03-01: 09:00:00 30 mg via ORAL
  Filled 2018-03-01: qty 1

## 2018-03-01 MED ORDER — TICAGRELOR 90 MG PO TABS: 90.0000 mg | ORAL_TABLET | Freq: Two times a day (BID) | ORAL | 0 refills | Status: DC

## 2018-03-01 MED ORDER — ISOSORBIDE MONONITRATE ER 30 MG PO TB24: 30.0000 mg | ORAL_TABLET | Freq: Every day | ORAL | 0 refills | Status: DC

## 2018-03-01 MED FILL — Heparin Sod (Porcine)-NaCl IV Soln 1000 Unit/500ML-0.9%: INTRAVENOUS | Qty: 1000 | Status: AC

## 2018-03-01 NOTE — Progress Notes (Signed)
    Talked with patient about th option of switching to plavix, but would need to load and would like to observe overnight. Patient reports he does not want to stay but will continue on Brilinta, He is aware of the importance of continuing Brilinta without missing doses. He is agreeable to continue for the next couple of weeks to see if side effects improve. Will arrange follow up appt in the office.   SignedLaverda Page, NP-C 03/01/2018, 2:01 PM Pager: [phone]

## 2018-03-01 NOTE — Progress Notes (Signed)
Patient complaining of feeling short of breath and stated he feels it is related to the Brilinta and would like to be put on a different antiplatelet as discussed with Dr. Clifton James this morning. Given tylenol 650 mg at 1305 for complaint of headache. No other complaints at this time. Ambulated in hall with staff, slow steady gait, tolerated well. Denies weakness, feeling lightheaded or dizzy, chest pain, pressure or tightness with ambulation. Only complaint was shortness of breath he felt prior to ambulating and stated that was consistent with shortness of breath related to taking Brilinta. Will notify PA of above information. Recheck patient in 1 hour to see if tylenol resolves headache.

## 2018-03-01 NOTE — Discharge Summary (Signed)
Physician Discharge Summary  Joshua Rojas ZOX:[id number] DOB: 03/19/1966 DOA: 02/25/2018  PCP: [facility]  Admit date: 02/25/2018 Discharge date: 03/01/2018  Admitted From: Home Disposition:  Home  Recommendations for Outpatient Follow-up:  1. Follow up with PCP in 1 week 2. Follow up with Cardiology, cardiology will arrange follow up appointment  3. Cardiac rehab as outpatient   Discharge Condition: Stable CODE STATUS: Full  Diet recommendation: Heart healthy   Brief/Interim Summary: Joshua Rojas is a 52 y.o.malewith PMH of hypertension, CAD status post PCI in 2011, MI in 1997, interstitial cystitis, anxiety/depression who was admitted 02/25/18 for evaluation of right-sided chest pain associated with 35 pound weight gain.  He describes his chest pain is a right chest tightness that radiated up to his right jaw and down his right arm.  Troponins subsequently elevated and patient diagnosed with a non-STEMI.  He was started on heparin drip, cardiology consulted.  He underwent heart catheterization 4/29 with PCI.  Discharge Diagnoses:  Principal Problem:   Non-ST elevation (NSTEMI) myocardial infarction [facility]) Active Problems:   Chest pain   Interstitial cystitis   CAD (coronary artery disease)   Hyperlipidemia   GERD (gastroesophageal reflux disease)   Essential hypertension   Anxiety and depression   Obesity with comorbidities   NSTEMI  -Troponin peak at 4.58 consistent with NSTEMI. Initial EKG negative for ischemic changes. 2-D echo showed an EF of 60-65 percent with no regional wall motion abnormalities. Troponin further increased to 7.2 post PCI -Cardiology consulted -Heart cath 4/29 s/p PCI to LAD  -Discharge on aspirin, brilinta, imdur, toprol, lipitor   Interstitial cystitis -Continue Flomax  GERD (gastroesophageal reflux disease) -Continue PPI   Essential hypertension -Continue metoprolol  Depression/anxiety -Continue Celexa  and as needed Xanax  Obesity with comorbidities -Body mass index is 36.88 kg/m.    Discharge Instructions  Discharge Instructions    AMB Referral to Cardiac Rehabilitation - Phase II   Complete by:  As directed    Diagnosis:  Coronary Stents   Amb Referral to Cardiac Rehabilitation   Complete by:  As directed    Diagnosis:   NSTEMI Coronary Stents     Call MD for:   Complete by:  As directed    Recurrent chest pain, shortness of breath   Call MD for:  difficulty breathing, headache or visual disturbances   Complete by:  As directed    Call MD for:  extreme fatigue   Complete by:  As directed    Call MD for:  hives   Complete by:  As directed    Call MD for:  persistant dizziness or light-headedness   Complete by:  As directed    Call MD for:  persistant nausea and vomiting   Complete by:  As directed    Call MD for:  severe uncontrolled pain   Complete by:  As directed    Call MD for:  temperature >100.4   Complete by:  As directed    Diet - low sodium heart healthy   Complete by:  As directed    Discharge instructions   Complete by:  As directed    You were cared for by a hospitalist during your hospital stay. If you have any questions about your discharge medications or the care you received while you were in the hospital after you are discharged, you can call the unit and ask to speak with the hospitalist on call if the hospitalist that took care of you  is not available. Once you are discharged, your primary care physician will handle any further medical issues. Please note that NO REFILLS for any discharge medications will be authorized once you are discharged, as it is imperative that you return to your primary care physician (or establish a relationship with a primary care physician if you do not have one) for your aftercare needs so that they can reassess your need for medications and monitor your lab values.   Increase activity slowly   Complete by:  As directed       Allergies as of 03/01/2018      Reactions   Benadryl [diphenhydramine Hcl] Other (See Comments)   Makes interstitial cystitis worse   Sudafed [pseudoephedrine] Other (See Comments)   Makes interstitial cystitis worse   Triprolidine-pse Other (See Comments)   Makes interstitial cystitis worse      Medication List    STOP taking these medications   azithromycin 250 MG tablet Commonly known as:  ZITHROMAX Z-PAK   HYDROcodone-homatropine 5-1.5 MG/5ML syrup Commonly known as:  HYCODAN   simvastatin 20 MG tablet Commonly known as:  ZOCOR     TAKE these medications   ALPRAZolam 1 MG tablet Commonly known as:  XANAX Take 0.5-2 mg by mouth See admin instructions. Take 0.5 mg by mouth two times a day and 2 mg at bedtime   aspirin EC 81 MG tablet Take 81 mg by mouth daily.   atorvastatin 80 MG tablet Commonly known as:  LIPITOR Take 1 tablet (80 mg total) by mouth daily at 10 pm.   azelastine 0.1 % nasal spray Commonly known as:  ASTELIN Place 2 sprays into both nostrils daily as needed for rhinitis or allergies.   citalopram 40 MG tablet Commonly known as:  CELEXA Take 40 mg by mouth at bedtime.   fluticasone 50 MCG/ACT nasal spray Commonly known as:  FLONASE Place 2 sprays into both nostrils daily as needed for allergies or rhinitis.   gabapentin 600 MG tablet Commonly known as:  NEURONTIN Take 600 mg by mouth 3 (three) times daily.   HYDROcodone-acetaminophen 10-325 MG tablet Commonly known as:  NORCO Take 0.5-1 tablets by mouth See admin instructions. Take 1 tablet by mouth three times a day and may take an additional 0.5-1 tablet daily as needed   hydrOXYzine 25 MG tablet Commonly known as:  ATARAX/VISTARIL Take 25 mg by mouth at bedtime.   isosorbide mononitrate 30 MG 24 hr tablet Commonly known as:  IMDUR Take 1 tablet (30 mg total) by mouth daily. Start taking on:  03/02/2018   metoprolol succinate 50 MG 24 hr tablet Commonly known as:  TOPROL-XL Take 1  tablet (50 mg total) by mouth daily. Take with or immediately following a meal. Start taking on:  03/02/2018 What changed:    medication strength  how much to take   omeprazole 20 MG capsule Commonly known as:  PRILOSEC Take 20 mg by mouth daily with breakfast.   sodium chloride 0.65 % nasal spray Commonly known as:  OCEAN Place 1 spray into the nose 3 (three) times daily as needed for congestion.   tamsulosin 0.4 MG Caps capsule Commonly known as:  FLOMAX Take 0.4 mg by mouth at bedtime.   ticagrelor 90 MG Tabs tablet Commonly known as:  BRILINTA Take 1 tablet (90 mg total) by mouth 2 (two) times daily.      Follow-up Information    [facility]. Schedule an appointment as soon as  possible for a visit in 1 week(s).   Specialty:  Family Medicine Contact information: [address] [place] [address] [phone]        [facility] Office Follow up.   Specialty:  Cardiology Contact information: [address], [address] [place] [address] [phone]         Allergies  Allergen Reactions  . Benadryl [Diphenhydramine Hcl] Other (See Comments)    Makes interstitial cystitis worse  . Sudafed [Pseudoephedrine] Other (See Comments)    Makes interstitial cystitis worse  . Triprolidine-Pse Other (See Comments)    Makes interstitial cystitis worse    Consultations:  Cardiology    Procedures/Studies: Dg Chest 2 View  Result Date: 02/25/2018 CLINICAL DATA:  Chest pain EXAM: CHEST - 2 VIEW COMPARISON:  11/24/2013 FINDINGS: The heart size and mediastinal contours are within normal limits allowing for AP technique. Both lungs are clear. The visualized skeletal structures are unremarkable. IMPRESSION: No active cardiopulmonary disease. Electronically Signed   By: Deatra Robinson M.D.   On: 02/25/2018 19:28    Echo 4/27 Study Conclusions  - Left ventricle: The cavity size was  mildly dilated. Systolic   function was normal. The estimated ejection fraction was in the   range of 60% to 65%. Wall motion was normal; there were no   regional wall motion abnormalities. Left ventricular diastolic   function parameters were normal. - Mitral valve: There was trivial regurgitation. - Pulmonary arteries: Systolic pressure could not be accurately  estimated.    Heart cath 4/29  Prox LAD lesion is 95% stenosed.  Ost 1st Diag lesion is 80% stenosed.  Ost 2nd Mrg to 2nd Mrg lesion is 100% stenosed.  Post intervention, there is a 0% residual stenosis.  A stent was successfully placed.  Ost Ramus lesion is 50% stenosed.   Multivessel CAD with 95% eccentric proximal LAD stenosis in the region of the first small diagonal takeoff with 80% ostial narrowing in the small diagonal vessel; 50% eccentric ostial stenosis in the ramus intermediate vessel; probable chronic total occlusion of a previously placed circumflex marginal stent.  Normal RCA.  LVEDP 15 mmHg.  Successful PCI to the LAD with cutting balloon/PTCA, and ultimate DES stenting with a Resolute onyx 3.5 x 15 mm stent, postdilated to 3.73 mm with a 95% stenosis being reduced to 0%.  There is no change in the 80% ostial diagonal stenosis and a small caliber vessel which was not intervened upon.   Discharge Exam: Vitals:   03/01/18 0821 03/01/18 1138  BP: 100/68 (!) 121/52  Pulse: 63 (!) 56  Resp: 15 14  Temp: 98.6 F (37 C) 98.2 F (36.8 C)  SpO2: 94% 95%    General: Pt is alert, awake, not in acute distress Cardiovascular: RRR, S1/S2 +, no rubs, no gallops Respiratory: CTA bilaterally, no wheezing, no rhonchi Abdominal: Soft, NT, ND, bowel sounds + Extremities: no edema, no cyanosis    The results of significant diagnostics from this hospitalization (including imaging, microbiology, ancillary and laboratory) are listed below for reference.     Microbiology: No results found for this or any  previous visit (from the past 240 hour(s)).   Labs: BNP (last 3 results) Recent Labs    02/25/18 2141  BNP 50.1   Basic Metabolic Panel: Recent Labs  Lab 02/25/18 1804 02/26/18 0558 02/28/18 0650 03/01/18 0146  NA 140 142 139 139  K 3.6 4.2 3.5 3.9  CL 107 103 105 104  CO2 20* GLUCOSE 186* 83 87 107*  BUN CREATININE 1.00 1.00 1.11 1.04  CALCIUM 8.5* 8.5* 8.7* 8.8*   Liver Function Tests: Recent Labs  Lab 02/25/18 1804  AST 29  ALT 31  ALKPHOS 69  BILITOT 0.8  PROT 6.6  ALBUMIN 3.9   Recent Labs  Lab 02/25/18 1804  LIPASE 43   No results for input(s): AMMONIA in the last 168 hours. CBC: Recent Labs  Lab 02/25/18 1804 02/26/18 0558 02/27/18 0914 02/28/18 0650 03/01/18 0146  WBC 10.4 8.5 6.6 6.8 8.5  NEUTROABS  --  4.0  --   --   --   HGB 16.5 16.5 16.4 16.3 14.8  HCT 47.2 48.9 48.0 46.6 43.0  MCV 91.3 93.1 92.7 91.6 90.3  PLT 176 177 159 161 175   Cardiac Enzymes: Recent Labs  Lab 02/26/18 2026 02/28/18 1248 02/28/18 1837 03/01/18 0034 03/01/18 0811  TROPONINI 3.77* 1.90* 1.84* 3.84* 7.22*   BNP: Invalid input(s): POCBNP CBG: No results for input(s): GLUCAP in the last 168 hours. D-Dimer No results for input(s): DDIMER in the last 72 hours. Hgb A1c Recent Labs    02/27/18 1032  HGBA1C 5.4   Lipid Profile No results for input(s): CHOL, HDL, LDLCALC, TRIG, CHOLHDL, LDLDIRECT in the last 72 hours. Thyroid function studies No results for input(s): TSH, T4TOTAL, T3FREE, THYROIDAB in the last 72 hours.  Invalid input(s): FREET3 Anemia work up No results for input(s): VITAMINB12, FOLATE, FERRITIN, TIBC, IRON, RETICCTPCT in the last 72 hours. Urinalysis No results found for: COLORURINE, APPEARANCEUR, LABSPEC, PHURINE, GLUCOSEU, HGBUR, BILIRUBINUR, KETONESUR, PROTEINUR, UROBILINOGEN, NITRITE, LEUKOCYTESUR Sepsis Labs Invalid input(s): PROCALCITONIN,  WBC,  LACTICIDVEN Microbiology No results found for this or any  previous visit (from the past 240 hour(s)).   Patient was seen and examined on the day of discharge and was found to be in stable condition. Time coordinating discharge: 35 minutes including assessment and coordination of care, as well as examination of the patient.   SIGNED:  Noralee Stain, DO [facility] Pager [phone]  If 7PM-7AM, please contact night-coverage [email or web address] Password [email or web address] 03/01/2018, 2:07 PM

## 2018-03-01 NOTE — Progress Notes (Addendum)
Progress Note  Patient Name: Joshua Rojas Date of Encounter: 03/01/2018  Primary Cardiologist: No primary care provider on file.  Subjective   Feeling better this morning. Did have some Brilinta shortness of breath last night.   Inpatient Medications    Scheduled Meds: . ALPRAZolam  2 mg Oral QHS  . aspirin  81 mg Oral Daily  . atorvastatin  80 mg Oral Q2200  . citalopram  40 mg Oral QHS  . gabapentin  600 mg Oral TID  . hydrOXYzine  25 mg Oral QHS  . metoprolol succinate  50 mg Oral Daily  . pantoprazole  40 mg Oral QHS  . sodium chloride flush  3 mL Intravenous Q12H  . tamsulosin  0.4 mg Oral QHS  . ticagrelor  90 mg Oral BID   Continuous Infusions: . sodium chloride Stopped (03/01/18 0013)  . sodium chloride    . nitroGLYCERIN 15 mcg/min (03/01/18 0627)   PRN Meds: sodium chloride, acetaminophen, ALPRAZolam, azelastine, diazepam, fluticasone, gi cocktail, HYDROcodone-acetaminophen, HYDROmorphone (DILAUDID) injection, ondansetron (ZOFRAN) IV, ondansetron (ZOFRAN) IV, sodium chloride, sodium chloride flush   Vital Signs    Vitals:   02/28/18 2226 03/01/18 0407 03/01/18 0617 03/01/18 0618  BP:  120/62 114/65   Pulse:  (!) 59  (!) 59  Resp: Temp:  99.1 F (37.3 C)    TempSrc:  Oral    SpO2:  95%  94%  Weight:  264 lb 8.8 oz (120 kg)    Height:        Intake/Output Summary (Last 24 hours) at 03/01/2018 0742 Last data filed at 03/01/2018 0600 Gross per 24 hour  Intake 2844.74 ml  Output 670 ml  Net 2174.74 ml   Filed Weights   02/27/18 0604 02/28/18 0425 03/01/18 0407  Weight: 264 lb 6.4 oz (119.9 kg) 262 lb 6.4 oz (119 kg) 264 lb 8.8 oz (120 kg)    Telemetry    SB - Personally Reviewed  ECG    SB with continued TWI in inferolateral leads - Personally Reviewed  Physical Exam   General: Well developed, well nourished, male appearing in no acute distress. Head: Normocephalic, atraumatic.  Neck: Supple without bruits, JVD. Lungs:  Resp  regular and unlabored, CTA. Heart: RRR, S1, S2, no S3, S4, or murmur; no rub. Abdomen: Soft, non-tender, non-distended with normoactive bowel sounds. Extremities: No clubbing, cyanosis, edema. Distal pedal pulses are 2+ bilaterally. Right radial cath site stable.  Neuro: Alert and oriented X 3. Moves all extremities spontaneously. Psych: Normal affect.  Labs    Chemistry Recent Labs  Lab 02/25/18 1804 02/26/18 0558 02/28/18 0650 03/01/18 0146  NA 140 142 139 139  K 3.6 4.2 3.5 3.9  CL 107 103 105 104  CO2 20* GLUCOSE 186* 83 87 107*  BUN CREATININE 1.00 1.00 1.11 1.04  CALCIUM 8.5* 8.5* 8.7* 8.8*  PROT 6.6  --   --   --   ALBUMIN 3.9  --   --   --   AST 29  --   --   --   ALT 31  --   --   --   ALKPHOS 69  --   --   --   BILITOT 0.8  --   --   --   GFRNONAA >60 >60 >60 >60  GFRAA >60 >60 >60 >60  ANIONGAP Hematology Recent Labs  Lab 02/27/18 0914 02/28/18 0650 03/01/18 0146  WBC 6.6 6.8 8.5  RBC 5.18 5.09 4.76  HGB 16.4 16.3 14.8  HCT 48.0 46.6 43.0  MCV 92.7 91.6 90.3  MCH 31.7 32.0 31.1  MCHC 34.2 35.0 34.4  RDW 12.7 12.6 12.4  PLT 159 161 175    Cardiac Enzymes Recent Labs  Lab 02/26/18 2026 02/28/18 1248 02/28/18 1837 03/01/18 0034  TROPONINI 3.77* 1.90* 1.84* 3.84*    Recent Labs  Lab 02/25/18 1815 02/25/18 2034  TROPIPOC 0.02 0.04     BNP Recent Labs  Lab 02/25/18 2141  BNP 50.1     DDimer  Recent Labs  Lab 02/25/18 2128  DDIMER 0.27      Radiology    No results found.  Cardiac Studies   Cath: 02/28/18  Conclusion     Prox LAD lesion is 95% stenosed.  Ost 1st Diag lesion is 80% stenosed.  Ost 2nd Mrg to 2nd Mrg lesion is 100% stenosed.  Post intervention, there is a 0% residual stenosis.  A stent was successfully placed.  Ost Ramus lesion is 50% stenosed.   Multivessel CAD with 95% eccentric proximal LAD stenosis in the region of the first small diagonal takeoff with 80%  ostial narrowing in the small diagonal vessel; 50% eccentric ostial stenosis in the ramus intermediate vessel; probable chronic total occlusion of a previously placed circumflex marginal stent.  Normal RCA.  LVEDP 15 mmHg.  Successful PCI to the LAD with cutting balloon/PTCA, and ultimate DES stenting with a Resolute onyx 3.5 x 15 mm stent, postdilated to 3.73 mm with a 95% stenosis being reduced to 0%.  There is no change in the 80% ostial diagonal stenosis and a small caliber vessel which was not intervened upon.   TTE: 02/23/18  Study Conclusions  - Left ventricle: The cavity size was mildly dilated. Systolic   function was normal. The estimated ejection fraction was in the   range of 60% to 65%. Wall motion was normal; there were no   regional wall motion abnormalities. Left ventricular diastolic   function parameters were normal. - Mitral valve: There was trivial regurgitation. - Pulmonary arteries: Systolic pressure could not be accurately   estimated.  Patient Profile     52 y.o. male with a history of CAD status post PCI 2011 and remote MI 1997, hypertension, anxiety/depression who awakened acutely in the middle the night with right substernal chest tightness and 9/10. He says the pain radiated into his jaw and right arm. He thought it was related to gas but the symptoms persisted and then he started having diaphoresis along with belching and nausea. He has no history of heart failure but is gained 35 pounds in the past few months. Said his symptoms were similar to his MI that he had a few years ago.  Assessment & Plan    1. NSTEMI: Trop peaked at 4.58 prior to cath. Underwent PCI/DES x1 to the mLAD, with jailing of small caliber 80% diag. Did have chest pain in the lab and on the floor. Has been on IV nitro but now weaning. Will stop and add Imdur  daily. Trop with increase to 3.84 post cath. Will recheck this morning. Ambulate with cardiac rehab. Plan for DAPT with  ASA/Brilinta.   2. CAD: remote MI 1997, and subsq PCI 2011. Remains on BB, ASA, and now high dose statin.   3. HL: LDL 91, on Zocor at home, now switched to high dose lipitor.   4.  HTN: stable with current therapy.   Signed, Laverda Page, NP  03/01/2018, 7:42 AM  Pager # [phone]   For questions or updates, please contact [facility] Please consult [email or web address] for contact info under Cardiology/STEMI.  I have personally seen and examined this patient. I agree with the assessment and plan as outlined above. He was admitted with a NSTEMI. He is s/p stenting of his LAD. He is on ASA and Brilinta. No chest pain this am. BP stable. Troponin peak at 7.2 post PCI. I think the Diagonal branch was jailed by the LAD stent and this may explain his troponin rise and pain. He also had dyspnea last night that may have been due to his Brilinta. I discussed watching him until tomorrow but he feels better and wants to go home.  We started Imdur this am. He agrees to stay until this afternoon. Will ambulate. If no recurrent pain, d/c home today after 3pm. If he has more dyspnea with Brilinta, will need to switch to Effient.   Verne Carrow 03/01/2018 9:51 AM

## 2018-03-01 NOTE — Progress Notes (Signed)
On discharge patients breathing is unlabored, regular and insured patient that the breathing usually will improve daily as the body adjusts to the Brilinta. Patient stated that the headache was relieved with tylenol but is coming back. Reviewed that with this medication usually the headache will decrease daily and eventually go away as the body adjusts. Instructed the patient to take his blood pressure and keep a log to review with cardiology at followup. Also to call cardiology office on Thursday if he hasn't heard from then to schedule a follow up. Patient also instructed to call cardiology office with any problems with radial site, recurring symptoms or persistent headache or shortness of breath from medications if not resolving. Patient verbalized good understanding.

## 2018-03-01 NOTE — Progress Notes (Signed)
1500 Patint ambulated in hall with slow steady gait. No s/s intolerance noted. Shortness of breath unchanged with activity and patient denied any symptoms after ambulated. Verbalized wanting to go home and feeling comfortable being discharged at this time. No complaints of chest pain, tightness or pressure this afternoon.

## 2018-03-01 NOTE — Progress Notes (Signed)
CARDIAC REHAB PHASE I   PRE:  Rate/Rhythm: 57 SB  BP:  Supine:   Sitting: 100/68  Standing:    SaO2:   MODE:  Ambulation: 500 ft   POST:  Rate/Rhythm: 78 SR  BP:  Supine:   Sitting: 146/82  Standing:    SaO2: 98%RA 0830-0925 Pt walked 500 ft on RA with steady gait and tolerated well. No c/o CP. MI education completed with pt who voiced understanding. Stressed importance of brilinta with stent. Needs brilinta card. Reviewed NTG use, MI restrictions, gave heart healthy diet, ex ed and smoking cessation. Gave fake cigarette and smoking cessation handout. Pt has quit for a year in the past. Contemplating quitting cold [place]. Pt stated that what he eats and activity affect his interstitial cystitis .Marland Kitchen Discussed CRP 2 and referred to [facility] and he can discuss with staff in program.   Luetta Nutting, RN BSN  03/01/2018 9:20 AM

## 2018-03-02 ENCOUNTER — Telehealth: Payer: Self-pay | Admitting: Cardiology

## 2018-03-02 NOTE — Telephone Encounter (Signed)
OK to change to Plavix.  Please confirm with Remus Loffler, Pharm D whether he will needa Plavix load

## 2018-03-02 NOTE — Telephone Encounter (Signed)
Pt calling and stated the Brilinta is causing breathing problems and pt was told if this happens he could be switch to Plavix. Please advise pt.

## 2018-03-02 NOTE — Telephone Encounter (Signed)
Returned call to patient. Patient with recent NSTEMI s/p DES to LAD on 4/29. Patient has been taking Brilinta 90 mg BID. Patient complaining of SOB since he started the Brilinta. He states that the SOB comes and goes and that he has had trouble sleeping. He denies any other complaints. Patient states that he was told in the hospital that he could switch to Plavix if he had SOB. Advised patient to try and take the Brilinta with a soda or caffeine as this sometimes helps. Patient states that he has interstitial cystitis and he cannot have any caffeine. Made patient aware that I would forward the information to Dr. Mayford Knife for review and recommendation. Patient verbalized understanding.

## 2018-03-03 MED ORDER — CLOPIDOGREL BISULFATE 75 MG PO TABS: 75.0000 mg | ORAL_TABLET | Freq: Every day | ORAL | 11 refills | Status: DC

## 2018-03-03 MED ORDER — CLOPIDOGREL BISULFATE 75 MG PO TABS: 300.0000 mg | ORAL_TABLET | Freq: Once | ORAL | 0 refills | Status: AC

## 2018-03-03 NOTE — Telephone Encounter (Signed)
Patient made aware okay to stop Brilinta and Start Plavix. I explained to patient that with his next scheduled dose of Brilinta he will take the loading dose of Plavix 300 mg and then 75 mg once a day. Patient verbalized understanding thankful for the call.

## 2018-03-03 NOTE — Telephone Encounter (Signed)
Follow up  Patient calling back for recommendation  Preferred Pharmacy - [id number] - [place], [place] - [address]

## 2018-03-03 NOTE — Telephone Encounter (Signed)
Follow up   Patient calling to request Plavix be called to his preferred   Pt c/o Shortness Of Breath: STAT if SOB developed within the last 24 hours or pt is noticeably SOB on the phone  1. Are you currently SOB (can you hear that pt is SOB on the phone)? Patient states he is still SOB currently  2. How long have you been experiencing SOB?   3. Are you SOB when sitting or when up moving around? Sitting and loving   4. Are you currently experiencing any other symptoms? No

## 2018-03-03 NOTE — Telephone Encounter (Signed)
Yes would recommend reload given we are only a few days out from stent placement. Load will be  when next dose of Brilinta due then  once daily.

## 2018-03-08 ENCOUNTER — Encounter: Payer: Self-pay | Admitting: Physician Assistant

## 2018-03-08 ENCOUNTER — Telehealth (HOSPITAL_COMMUNITY): Payer: Self-pay

## 2018-03-08 ENCOUNTER — Telehealth: Payer: Self-pay | Admitting: Cardiology

## 2018-03-08 NOTE — Telephone Encounter (Signed)
I spoke with patient. Patient was started on Imdur 30 mg on 03/02/18 s/p heart cath with PCI on 02/28/18. Patient c/o severe headache that is not relieved with Tylenol or ibuprofen. Patient states he started taking a half tablet and the headaches are the same. Patient denies any chest pain. I informed patient I would forward to Dr. Mayford Knife for further recommendations. He verbalized understanding and thankful for the call.

## 2018-03-08 NOTE — Telephone Encounter (Signed)
Ok to stop Imdur - if he has recurrent CP then may need to consider Ranexa

## 2018-03-08 NOTE — Telephone Encounter (Signed)
Left message on VM informing patient per Dr. Mayford Knife okay to stop Imdur and call office with any recurrent chest pain.

## 2018-03-08 NOTE — Telephone Encounter (Signed)
Pt c/o medication issue:  1. Name of Medication:  isosorbide mononitrate (IMDUR) 30 MG 24 hr tablet    2. How are you currently taking this medication (dosage and times per day)? Take 1 tablet (30 mg total) by mouth daily.  3. Are you having a reaction (difficulty breathing--STAT)? no  4. What is your medication issue? Extreme headaches  Pt is calling for advice on what to do about the pain he is taking 800 ibuprofen with the pill, and he states that he is taking a very small amount   No longer taking the whole pill

## 2018-03-08 NOTE — Telephone Encounter (Signed)
Patients insurance is active and benefits verified through Medicare A/B - No co-pay, no deductible, no out of pocket, no co-insurance, and no pre-authorization is required. Passport/reference [phone]  Patients insurance is active through [facility] - Reference [phone] Will fax Medicaid Reimbursement Form to Dr.Turner.  Will contact patient to see if he is interested in the Cardiac Rehab Program. If interested, patient will need to complete follow up appt. Once completed, patient will be contacted for scheduling upon review by the RN Navigator.

## 2018-03-08 NOTE — Telephone Encounter (Signed)
Attempted to call patient to see if he is interested in the Cardiac Rehab Program - lm on vm °

## 2018-03-14 ENCOUNTER — Ambulatory Visit: Payer: Medicare Other | Admitting: Physician Assistant

## 2018-03-21 ENCOUNTER — Telehealth: Payer: Self-pay

## 2018-03-21 DIAGNOSIS — I214 Non-ST elevation (NSTEMI) myocardial infarction: Secondary | ICD-10-CM

## 2018-03-21 NOTE — Telephone Encounter (Signed)
Received fax for approval for cardiac rehab.   Per Dr. Radford Pax patient will need ETT to assess met level   Called patient left message that patient will need exercise tolerance test prior to starting cardiac rehab to assess met level   ETT order placed, to be scheduled.

## 2018-03-30 ENCOUNTER — Ambulatory Visit (INDEPENDENT_AMBULATORY_CARE_PROVIDER_SITE_OTHER): Payer: Medicare Other | Admitting: Cardiology

## 2018-03-30 ENCOUNTER — Encounter: Payer: Self-pay | Admitting: Cardiology

## 2018-03-30 VITALS — BP 112/72 | HR 62 | Ht 71.0 in | Wt 254.6 lb

## 2018-03-30 DIAGNOSIS — E785 Hyperlipidemia, unspecified: Secondary | ICD-10-CM | POA: Diagnosis not present

## 2018-03-30 MED ORDER — NITROGLYCERIN 0.4 MG SL SUBL: 0.4000 mg | SUBLINGUAL_TABLET | SUBLINGUAL | 3 refills | Status: DC | PRN

## 2018-03-30 MED ORDER — SIMVASTATIN 40 MG PO TABS: 40.0000 mg | ORAL_TABLET | Freq: Every day | ORAL | 2 refills | Status: DC

## 2018-03-30 NOTE — Patient Instructions (Signed)
Medication Instructions:   START TAKING SIMVASTATIN 40 MG ONCE A DAY   START TAKING NITROGLYCERIN 0.4 MG SUBLINGUIAL  AS NEEDED FOR CHEST PAIN   If you need a refill on your cardiac medications before your next appointment, please call your pharmacy.  Labwork: LIVER  AND LIPID IN 6 WEEKS   Testing/Procedures:  NONE ORDERED  TODAY    Follow-Up:  IN 3 MONTHS WITH DR TURNER    Any Other Special Instructions Will Be Listed Below (If Applicable).

## 2018-03-30 NOTE — Telephone Encounter (Signed)
Pt stated he is not interested in doing cardiac rehab at the moment due to other health complications. I advised pt that he would have to have a GXT prior to approval for cardiac rehab. He stated understanding and stated he would call back if he wanted to move forward with rehab at a later date.

## 2018-03-30 NOTE — Progress Notes (Signed)
03/30/2018 Joshua Rojas   Nov 19, 1965  [id number]  [facility] Primary Cardiologist:  Dr. Mayford Knife  Reason for Visit/CC: [facility] F/u for CAD s/p NSTEMI  HPI:  Joshua Rojas is a 52 y.o. male who is being seen today for hospital follow-up after recent management for non-STEMI.  He has a prior history of CAD with remote MI and PCI in 2011.  Additional history includes hypertension, anxiety and depression.  He recently presented to [facility] with complaints of acute substernal chest pain waking him in the middle of the night, radiating to his jaw and right arm.  He initially thought it was gas but symptoms persisted and he then developed diaphoresis and nausea, prompting him to go to the ED.  There he was found to have an elevated troponin consistent with non-STEMI.  Troponin level peaked at 4.58.  He underwent cardiac catheterization and underwent PCI/drug-eluting stent x1 to the mid LAD with jailing of a small caliber 80% diagonal.  Left ventricular EF was normal by cardiac catheterization and echocardiogram at 60 to 65%.  He was placed on dual antiplatelet therapy with aspirin and Brilinta.  He did have some residual chest pain post cath and was continued on IV nitro but was ultimately weaned off and started on p.o. Imdur 30 mg daily.  His chest pain ultimately resolved.  He was also placed on high-dose statin therapy.  Simvastatin was discontinued and he was switched to high-dose Lipitor. LDL was elevated at 91 mg/dL during hospitalization.   He presents to clinic today for post hospital follow-up.  It appears that after his hospital discharge he developed intolerance to Imdur due to headaches.  This was discontinued.  Dr. Mayford Knife was notified and reported that if he were to have recurrent chest pain we can consider addition of Ranexa. He was also unable to tolerate Brilinta due to dysnea and has been transitioned to Plavix. Also had issues  with Lipitor, which he also felt was causing symptoms. He has since changed back to simvastatin 20 mg.  Otherwise, the patient reports that he has done fairly well since discharge.  No recurrent chest pain.  He was ordered to start cardiac rehab but reports that he is unable to start this now due to issues with interstial cystitis. Not able to engage in regular exercise, per pt report.   Cardiac studies  Procedures- [facility] 02/28/18   CORONARY STENT INTERVENTION  LEFT HEART CATH AND CORONARY ANGIOGRAPHY  Conclusion     Prox LAD lesion is 95% stenosed.  Ost 1st Diag lesion is 80% stenosed.  Ost 2nd Mrg to 2nd Mrg lesion is 100% stenosed.  Post intervention, there is a 0% residual stenosis.  A stent was successfully placed.  Ost Ramus lesion is 50% stenosed.   Multivessel CAD with 95% eccentric proximal LAD stenosis in the region of the first small diagonal takeoff with 80% ostial narrowing in the small diagonal vessel; 50% eccentric ostial stenosis in the ramus intermediate vessel; probable chronic total occlusion of a previously placed circumflex marginal stent.  Normal RCA.  LVEDP 15 mmHg.  Successful PCI to the LAD with cutting balloon/PTCA, and ultimate DES stenting with a Resolute onyx 3.5 x 15 mm stent, postdilated to 3.73 mm with a 95% stenosis being reduced to 0%.  There is no change in the 80% ostial diagonal stenosis and a small caliber vessel which was not intervened upon.    2D Echo 02/26/18 Study Conclusions  -  Left ventricle: The cavity size was mildly dilated. Systolic   function was normal. The estimated ejection fraction was in the   range of 60% to 65%. Wall motion was normal; there were no   regional wall motion abnormalities. Left ventricular diastolic   function parameters were normal. - Mitral valve: There was trivial regurgitation. - Pulmonary arteries: Systolic pressure could not be accurately   estimated.  Current Meds  Medication Sig  . ALPRAZolam  (XANAX) 1 MG tablet Take 0.5-2 mg by mouth See admin instructions. Take 0.5 mg by mouth two times a day and 2 mg at bedtime  . amoxicillin-clavulanate (AUGMENTIN) 875-125 MG tablet Take 1 tablet by mouth 2 (two) times daily. for 10 days  . aspirin EC 81 MG tablet Take 81 mg by mouth daily.   Marland Kitchen azelastine (ASTELIN) 0.1 % nasal spray Place 2 sprays into both nostrils daily as needed for rhinitis or allergies.   . citalopram (CELEXA) 40 MG tablet Take 40 mg by mouth at bedtime.   . clopidogrel (PLAVIX) 75 MG tablet Take 1 tablet (75 mg total) by mouth daily.  . fluticasone (FLONASE) 50 MCG/ACT nasal spray Place 2 sprays into both nostrils daily as needed for allergies or rhinitis.   Marland Kitchen gabapentin (NEURONTIN) 600 MG tablet Take 600 mg by mouth 3 (three) times daily.  Marland Kitchen HYDROcodone-acetaminophen (NORCO) 10-325 MG per tablet Take 0.5-1 tablets by mouth See admin instructions. Take 1 tablet by mouth three times a day and may take an additional 0.5-1 tablet daily as needed  . hydrOXYzine (ATARAX/VISTARIL) 25 MG tablet Take 25 mg by mouth at bedtime.   . metoprolol succinate (TOPROL-XL) 50 MG 24 hr tablet Take 1 tablet (50 mg total) by mouth daily. Take with or immediately following a meal.  . omeprazole (PRILOSEC) 20 MG capsule Take 20 mg by mouth daily with breakfast.  . simvastatin (ZOCOR) 20 MG tablet Take 20 mg by mouth daily.  . sodium chloride (OCEAN) 0.65 % nasal spray Place 1 spray into the nose 3 (three) times daily as needed for congestion.   . Tamsulosin HCl (FLOMAX) 0.4 MG CAPS Take 0.4 mg by mouth at bedtime.    Allergies  Allergen Reactions  . Benadryl [Diphenhydramine Hcl] Other (See Comments)    Makes interstitial cystitis worse  . Sudafed [Pseudoephedrine] Other (See Comments)    Makes interstitial cystitis worse  . Triprolidine-Pse Other (See Comments)    Makes interstitial cystitis worse   Past Medical History:  Diagnosis Date  . Anginal pain (HCC)   . Coronary artery disease      s/p remote 1997 and subsequent PCI  . GERD (gastroesophageal reflux disease)   . H/O heart artery stent   . Hypertension   . Interstitial cystitis   . MI, old    Family History  Problem Relation Age of Onset  . Diabetes Father    Past Surgical History:  Procedure Laterality Date  . CARDIAC CATHETERIZATION    . CARDIAC CATHETERIZATION  02/28/2018  . CHOLECYSTECTOMY    . CORONARY ANGIOPLASTY    . CORONARY STENT INTERVENTION  02/28/2018  . CORONARY STENT INTERVENTION N/A 02/28/2018   Procedure: CORONARY STENT INTERVENTION;  Surgeon: Lennette Bihari, MD;  Location: [facility] INVASIVE CV LAB;  Service: Cardiovascular;  Laterality: N/A;  . LEFT HEART CATH AND CORONARY ANGIOGRAPHY N/A 02/28/2018   Procedure: LEFT HEART CATH AND CORONARY ANGIOGRAPHY;  Surgeon: Lennette Bihari, MD;  Location: [facility] INVASIVE CV LAB;  Service: Cardiovascular;  Laterality:  N/A;   Social History   Socioeconomic History  . Marital status: Single    Spouse name: Not on file  . Number of children: Not on file  . Years of education: Not on file  . Highest education level: Not on file  Occupational History  . Not on file  Social Needs  . Financial resource strain: Not on file  . Food insecurity:    Worry: Not on file    Inability: Not on file  . Transportation needs:    Medical: Not on file    Non-medical: Not on file  Tobacco Use  . Smoking status: Current Every Day Smoker    Packs/day: 1.00    Years: 35.00    Pack years: 35.00    Types: Cigarettes  . Smokeless tobacco: Never Used  Substance and Sexual Activity  . Alcohol use: No  . Drug use: No  . Sexual activity: Not Currently  Lifestyle  . Physical activity:    Days per week: Not on file    Minutes per session: Not on file  . Stress: Not on file  Relationships  . Social connections:    Talks on phone: Not on file    Gets together: Not on file    Attends religious service: Not on file    Active member of club or organization: Not on file     Attends meetings of clubs or organizations: Not on file    Relationship status: Not on file  . Intimate partner violence:    Fear of current or ex partner: Not on file    Emotionally abused: Not on file    Physically abused: Not on file    Forced sexual activity: Not on file  Other Topics Concern  . Not on file  Social History Narrative  . Not on file     Review of Systems: General: negative for chills, fever, night sweats or weight changes.  Cardiovascular: negative for chest pain, dyspnea on exertion, edema, orthopnea, palpitations, paroxysmal nocturnal dyspnea or shortness of breath Dermatological: negative for rash Respiratory: negative for cough or wheezing Urologic: negative for hematuria Abdominal: negative for nausea, vomiting, diarrhea, bright red blood per rectum, melena, or hematemesis Neurologic: negative for visual changes, syncope, or dizziness All other systems reviewed and are otherwise negative except as noted above.   Physical Exam:  Blood pressure 112/72, pulse 62, height  (1.803 m), weight 254 lb 9.6 oz (115.5 kg), SpO2 99 %.  General appearance: alert, cooperative, no distress and moderately obese Neck: no carotid bruit and no JVD Lungs: clear to auscultation bilaterally Heart: regular rate and rhythm, S1, S2 normal, no murmur, click, rub or gallop Extremities: extremities normal, atraumatic, no cyanosis or edema Pulses: 2+ and symmetric Skin: Skin color, texture, turgor normal. No rashes or lesions Neurologic: Grossly normal  EKG not perormed -- personally reviewed   ASSESSMENT AND PLAN:   1. CAD: Recent admission for non-STEMI April 2019 with troponin peaking at 4.58.  Cardiac catheterization showed 95% proximal LAD stenosis successfully treated with PCI plus drug-eluting stenting.  Left ventricular EF was normal. There is no change in the 80% ostial diagonal stenosis and a small caliber vessel which was not intervened upon (too small to stent). He  is doing well w/o recurrent CP. Unable to tolerate Brilinta due to dyspnea. He has been transitioned to Plavix. Continue ASA. Intolerant to Imdur due to head acnes. This was discontinued. No recurrent CP. Will call in Rx for PRN SL  NTG. Also had issues with Lipitor and changed back to simvastatin, 20 mg. LDL 91. Will increase to 40 mg daily. Repeat FLP in 6 weeks.   2. HLD:  lipid panel at time of hospitalization April 2019 showed elevated LDL at 91 mg/dL.  Goal in the setting of known CAD and recent non-STEMI is less than 70 mg/dL.  Simvastatin was initially changed to Lipitor but had side effects. Pt went back to simvastatin 20. Will increase to 40 mg.  He will need repeat fasting lipid panel again in 4 more weeks along with hepatic function test.    3. HTN: Controlled on current regimen  4. Tobacco Use: smoking cessation advised. He has started Chantix.  Follow-Up w/ Dr. Mayford Knife in 3 months.   Josee Speece Delmer Islam, MHS [facility] 03/30/2018 1:49 PM

## 2018-04-11 ENCOUNTER — Telehealth (HOSPITAL_COMMUNITY): Payer: Self-pay

## 2018-04-11 NOTE — Telephone Encounter (Signed)
Attempted to call patient in regards to cardiac Rehab - lm on vm

## 2018-05-02 ENCOUNTER — Telehealth (HOSPITAL_COMMUNITY): Payer: Self-pay

## 2018-05-02 NOTE — Telephone Encounter (Signed)
2nd attempt to contact patient in regards to Cardiac Rehab - lm on vm. Sending letter. °

## 2018-05-09 ENCOUNTER — Telehealth (HOSPITAL_COMMUNITY): Payer: Self-pay

## 2018-05-09 NOTE — Telephone Encounter (Signed)
3rd attempt to call patient in regards to Cardiac Rehab - lm on vm °

## 2018-05-11 ENCOUNTER — Other Ambulatory Visit: Payer: Medicare Other

## 2018-05-11 DIAGNOSIS — E785 Hyperlipidemia, unspecified: Secondary | ICD-10-CM

## 2018-05-12 LAB — HEPATIC FUNCTION PANEL
ALK PHOS: 77 IU/L (ref 39–117)
ALT: 26 IU/L (ref 0–44)
AST: 20 IU/L (ref 0–40)
Albumin: 4.2 g/dL (ref 3.5–5.5)
Bilirubin Total: 0.3 mg/dL (ref 0.0–1.2)
Bilirubin, Direct: 0.08 mg/dL (ref 0.00–0.40)
TOTAL PROTEIN: 6.6 g/dL (ref 6.0–8.5)

## 2018-05-12 LAB — LIPID PANEL
Chol/HDL Ratio: 5.6 ratio — ABNORMAL HIGH (ref 0.0–5.0)
Cholesterol, Total: 161 mg/dL (ref 100–199)
HDL: 29 mg/dL — AB (ref >39)
LDL CALC: 76 mg/dL (ref 0–99)
Triglycerides: 279 mg/dL — ABNORMAL HIGH (ref 0–149)
VLDL CHOLESTEROL CAL: 56 mg/dL — AB (ref 5–40)

## 2018-05-16 ENCOUNTER — Telehealth (HOSPITAL_COMMUNITY): Payer: Self-pay

## 2018-05-16 NOTE — Telephone Encounter (Signed)
No response  from patient - closed referral °

## 2018-05-30 ENCOUNTER — Encounter: Payer: Self-pay | Admitting: *Deleted

## 2018-06-09 ENCOUNTER — Telehealth: Payer: Self-pay | Admitting: Cardiology

## 2018-06-09 MED ORDER — EZETIMIBE 10 MG PO TABS: 10.0000 mg | ORAL_TABLET | Freq: Every day | ORAL | 3 refills | Status: DC

## 2018-06-09 NOTE — Telephone Encounter (Signed)
Reviewed instructions w/ patient. Pt is agreeable to starting the new medication. Prescription sent to [facility] per pt request. Follow up lab work scheduled for 9/24 Patient verbalized understanding and agreeable to plan.

## 2018-06-09 NOTE — Telephone Encounter (Signed)
Notes recorded by Allayne ButcherSimmons, Brittainy M, PA-C on 05/13/2018 at 2:29 PM EDT LDL is improved but still not quite at goal. Recommend addition of zetia to simvastatin, 10 mg daily. Repeat fasting lipids and hepatic function in 4 weeks.

## 2018-06-09 NOTE — Telephone Encounter (Signed)
Follow Up:    Pt returning call from 05-21-18 concerning his lab results.

## 2018-06-13 ENCOUNTER — Encounter: Payer: Self-pay | Admitting: Cardiology

## 2018-06-13 ENCOUNTER — Ambulatory Visit (INDEPENDENT_AMBULATORY_CARE_PROVIDER_SITE_OTHER): Payer: Medicare Other | Admitting: Cardiology

## 2018-06-13 VITALS — BP 134/76 | HR 78 | Ht 71.0 in | Wt 264.6 lb

## 2018-06-13 DIAGNOSIS — I10 Essential (primary) hypertension: Secondary | ICD-10-CM

## 2018-06-13 DIAGNOSIS — E785 Hyperlipidemia, unspecified: Secondary | ICD-10-CM | POA: Diagnosis not present

## 2018-06-13 DIAGNOSIS — I2510 Atherosclerotic heart disease of native coronary artery without angina pectoris: Secondary | ICD-10-CM

## 2018-06-13 NOTE — Patient Instructions (Signed)
Medication Instructions:  Your physician recommends that you continue on your current medications as directed. Please refer to the Current Medication list given to you today.  Labwork: Future: Liver and Lipid in 6 weeks  Follow-Up: Your physician wants you to follow-up in: 6 months with Robbie LisBrittainy Simmons, PA. You will receive a reminder letter in the mail two months in advance. If you don't receive a letter, please call our office to schedule the follow-up appointment.  Your physician wants you to follow-up in: 1 year with Dr. Mayford Knifeurner. You will receive a reminder letter in the mail two months in advance. If you don't receive a letter, please call our office to schedule the follow-up appointment.  If you need a refill on your cardiac medications before your next appointment, please call your pharmacy.

## 2018-06-13 NOTE — Progress Notes (Signed)
Cardiology Office Note:    Date:  06/13/2018   ID:  Joshua Rojas, DOB 04-12-1966, MRN [id number]  PCP:  [facility]  Cardiologist:  No primary care provider on file.    Referring MD: [facility]   Chief Complaint  Patient presents with  . Coronary Artery Disease  . Hypertension  . Hyperlipidemia    History of Present Illness:    Joshua Rojas is a 52 y.o. male with a hx of CAD with remote MI and PCI in 2011 with readmission for 2019 with chest pain and NSTEMI  Troponin level peaked at 4.58. Cath showed 95% proximal LAD, 80% D1 and occluded OM 2 and underwent PCI/drug-eluting stent x1 to the mid LAD with jailing of a small caliber 80% diagonal.  Left ventricular EF was normal by cath and and echo at 60 to 65%.  He was started on DAPT with aspirin and Alimta and also on Imdur 30 mg daily.  He was also placed on high-dose statin therapy atorvastatin.  Unfortunately he developed shortness of breath in which transition from Brilinta to Plavix.  He also felt that he was not tolerating Lipitor as well as he tolerated simvastatin he was changed back to simvastatin 40 mg daily.  Additional history includes hypertension, anxiety and depression.   He is here today for followup and is doing well.  He denies any chest pain or pressure, SOB, DOE, PND, orthopnea, LE edema, dizziness, palpitations or syncope. He is compliant with his meds and is tolerating meds with no SE. last LDL was 76 with try glycerides to 79 on 05/11/2018.  Past Medical History:  Diagnosis Date  . Coronary artery disease    s/p remote 1997 and subsequent PCI, remote MI 2011 and s/p NSTEMI 2019 with cath showing 95% proximal LAD, 80% D1 and occluded OM 2 and underwent PCI/drug-eluting stent x1 to the mid LAD with jailing of a small caliber 80% diagonal  . GERD (gastroesophageal reflux disease)   . H/O heart artery stent   . Hyperlipidemia LDL goal <70   . Hypertension   . Interstitial  cystitis   . MI, old     Past Surgical History:  Procedure Laterality Date  . CARDIAC CATHETERIZATION    . CARDIAC CATHETERIZATION  02/28/2018  . CHOLECYSTECTOMY    . CORONARY ANGIOPLASTY    . CORONARY STENT INTERVENTION  02/28/2018  . CORONARY STENT INTERVENTION N/A 02/28/2018   Procedure: CORONARY STENT INTERVENTION;  Surgeon: Lennette Bihari, MD;  Location: [facility] INVASIVE CV LAB;  Service: Cardiovascular;  Laterality: N/A;  . LEFT HEART CATH AND CORONARY ANGIOGRAPHY N/A 02/28/2018   Procedure: LEFT HEART CATH AND CORONARY ANGIOGRAPHY;  Surgeon: Lennette Bihari, MD;  Location: [facility] INVASIVE CV LAB;  Service: Cardiovascular;  Laterality: N/A;    Current Medications: Current Meds  Medication Sig  . ALPRAZolam (XANAX) 1 MG tablet Take 0.5-2 mg by mouth See admin instructions. Take 0.5 mg by mouth two times a day and 2 mg at bedtime  . amoxicillin-clavulanate (AUGMENTIN) 875-125 MG tablet Take 1 tablet by mouth 2 (two) times daily. for 10 days  . aspirin EC 81 MG tablet Take 81 mg by mouth daily.   Marland Kitchen azelastine (ASTELIN) 0.1 % nasal spray Place 2 sprays into both nostrils daily as needed for rhinitis or allergies.   . citalopram (CELEXA) 40 MG tablet Take 40 mg by mouth at bedtime.   . clopidogrel (PLAVIX) 75 MG tablet Take 1 tablet (75  mg total) by mouth daily.  Marland Kitchen. ezetimibe (ZETIA) 10 MG tablet Take 1 tablet (10 mg total) by mouth daily.  . fluticasone (FLONASE) 50 MCG/ACT nasal spray Place 2 sprays into both nostrils daily as needed for allergies or rhinitis.   Marland Kitchen. gabapentin (NEURONTIN) 600 MG tablet Take 600 mg by mouth 3 (three) times daily.  Marland Kitchen. HYDROcodone-acetaminophen (NORCO) 10-325 MG per tablet Take 0.5-1 tablets by mouth See admin instructions. Take 1 tablet by mouth three times a day and may take an additional 0.5-1 tablet daily as needed  . hydrOXYzine (ATARAX/VISTARIL) 25 MG tablet Take 25 mg by mouth at bedtime.   . metoprolol succinate (TOPROL-XL) 50 MG 24 hr tablet Take 1 tablet  (50 mg total) by mouth daily. Take with or immediately following a meal.  . nitroGLYCERIN (NITROSTAT) 0.4 MG SL tablet Place 1 tablet (0.4 mg total) under the tongue every 5 (five) minutes as needed for chest pain.  Marland Kitchen. omeprazole (PRILOSEC) 20 MG capsule Take 20 mg by mouth daily with breakfast.  . simvastatin (ZOCOR) 40 MG tablet Take 1 tablet (40 mg total) by mouth daily.  . sodium chloride (OCEAN) 0.65 % nasal spray Place 1 spray into the nose 3 (three) times daily as needed for congestion.   . Tamsulosin HCl (FLOMAX) 0.4 MG CAPS Take 0.4 mg by mouth at bedtime.      Allergies:   Benadryl [diphenhydramine hcl]; Sudafed [pseudoephedrine]; and Triprolidine-pse   Social History   Socioeconomic History  . Marital status: Single    Spouse name: Not on file  . Number of children: Not on file  . Years of education: Not on file  . Highest education level: Not on file  Occupational History  . Not on file  Social Needs  . Financial resource strain: Not on file  . Food insecurity:    Worry: Not on file    Inability: Not on file  . Transportation needs:    Medical: Not on file    Non-medical: Not on file  Tobacco Use  . Smoking status: Current Every Day Smoker    Packs/day: 1.00    Years: 35.00    Pack years: 35.00    Types: Cigarettes  . Smokeless tobacco: Never Used  Substance and Sexual Activity  . Alcohol use: No  . Drug use: No  . Sexual activity: Not Currently  Lifestyle  . Physical activity:    Days per week: Not on file    Minutes per session: Not on file  . Stress: Not on file  Relationships  . Social connections:    Talks on phone: Not on file    Gets together: Not on file    Attends religious service: Not on file    Active member of club or organization: Not on file    Attends meetings of clubs or organizations: Not on file    Relationship status: Not on file  Other Topics Concern  . Not on file  Social History Narrative  . Not on file     Family  History: The patient's family history includes Diabetes in his father.  ROS:   Please see the history of present illness.    ROS  All other systems reviewed and negative.   EKGs/Labs/Other Studies Reviewed:    The following studies were reviewed today: none  EKG:  EKG is not ordered today.  Recent Labs: 02/25/2018: B Natriuretic Peptide 50.1; TSH 0.572 03/01/2018: BUN 9; Creatinine, Ser 1.04; Hemoglobin 14.8; Platelets 175; Potassium  3.9; Sodium 139 05/11/2018: ALT 26   Recent Lipid Panel    Component Value Date/Time   CHOL 161 05/11/2018 1630   TRIG 279 (H) 05/11/2018 1630   HDL 29 (L) 05/11/2018 1630   CHOLHDL 5.6 (H) 05/11/2018 1630   CHOLHDL 4.4 02/26/2018 0558   VLDL 33 02/26/2018 0558   LDLCALC 76 05/11/2018 1630    Physical Exam:    VS:  BP 134/76   Pulse 78   Ht 5\' 11"  (1.803 m)   Wt 264 lb 9.6 oz (120 kg)   SpO2 98%   BMI 36.90 kg/m     Wt Readings from Last 3 Encounters:  06/13/18 264 lb 9.6 oz (120 kg)  03/30/18 254 lb 9.6 oz (115.5 kg)  03/01/18 264 lb 8.8 oz (120 kg)     GEN:  Well nourished, well developed in no acute distress HEENT: Normal NECK: No JVD; No carotid bruits LYMPHATICS: No lymphadenopathy CARDIAC: RRR, no murmurs, rubs, gallops RESPIRATORY:  Clear to auscultation without rales, wheezing or rhonchi  ABDOMEN: Soft, non-tender, non-distended MUSCULOSKELETAL:  No edema; No deformity  SKIN: Warm and dry NEUROLOGIC:  Alert and oriented x 3 PSYCHIATRIC:  Normal affect   ASSESSMENT:    1. Coronary artery disease involving native coronary artery of native heart without angina pectoris   2. Essential hypertension   3. Hyperlipidemia LDL goal <70    PLAN:    In order of problems listed above:  1.  ASCAD -  remote MI and PCI in 2011 with readmission for 2019 with chest pain and NSTEMI with troponin level peaked at 4.58. Cath showed 95% proximal LAD, 80% D1 and occluded OM 2 and underwent PCI/drug-eluting stent x1 to the mid LAD with  jailing of a small caliber 80% diagonal.  He has not had any further anginal symptoms.  He will continue on aspirin 81 mg daily, Plavix 75 mg daily, Toprol-XL 50 mg daily, atorvastatin 80 mg daily  2.  HTN - BP is well controlled on current meds.  He will continue on Toprol XL 50mg  daily  3.  Hyperlipidemia with LDL goal < 70.  He will continue on simvastatin 40mg  daily (intolerant to lipitor).  LDL was 91 on 02/26/2018 and started on Zetia 10mg  daily.  Repeat FLP and ALT in 6 weeks as he just started the Zetia 10mg  daily.     Medication Adjustments/Labs and Tests Ordered: Current medicines are reviewed at length with the patient today.  Concerns regarding medicines are outlined above.  No orders of the defined types were placed in this encounter.  No orders of the defined types were placed in this encounter.   Signed, Armanda Magicraci Brexlee Heberlein, MD  06/13/2018 4:39 PM     [facility]

## 2018-07-25 ENCOUNTER — Other Ambulatory Visit: Payer: Medicare Other

## 2018-07-25 DIAGNOSIS — I2510 Atherosclerotic heart disease of native coronary artery without angina pectoris: Secondary | ICD-10-CM

## 2018-07-25 DIAGNOSIS — E785 Hyperlipidemia, unspecified: Secondary | ICD-10-CM

## 2018-07-25 DIAGNOSIS — I10 Essential (primary) hypertension: Secondary | ICD-10-CM

## 2018-07-26 ENCOUNTER — Other Ambulatory Visit: Payer: Medicare Other

## 2018-07-26 LAB — HEPATIC FUNCTION PANEL
ALBUMIN: 4.2 g/dL (ref 3.5–5.5)
ALK PHOS: 82 IU/L (ref 39–117)
ALT: 23 IU/L (ref 0–44)
AST: 22 IU/L (ref 0–40)
BILIRUBIN TOTAL: 0.3 mg/dL (ref 0.0–1.2)
BILIRUBIN, DIRECT: 0.1 mg/dL (ref 0.00–0.40)
Total Protein: 6.4 g/dL (ref 6.0–8.5)

## 2018-07-26 LAB — LIPID PANEL
CHOLESTEROL TOTAL: 137 mg/dL (ref 100–199)
Chol/HDL Ratio: 4.9 ratio (ref 0.0–5.0)
HDL: 28 mg/dL — ABNORMAL LOW (ref >39)
LDL Calculated: 68 mg/dL (ref 0–99)
Triglycerides: 206 mg/dL — ABNORMAL HIGH (ref 0–149)
VLDL Cholesterol Cal: 41 mg/dL — ABNORMAL HIGH (ref 5–40)

## 2018-08-15 MED ORDER — ICOSAPENT ETHYL 1 G PO CAPS: 2.0000 g | ORAL_CAPSULE | Freq: Two times a day (BID) | ORAL | 3 refills | Status: DC

## 2018-08-15 NOTE — Telephone Encounter (Signed)
Spoke with the patient via MyChart, he would try to take the medication, if it is not too expensive.     Notes recorded by Quintella Reichert, MD on 07/28/2018 at 12:10 PM EDT Please order the Vascepa 2 g twice daily as well as avoidance of alcohol and decreasing carbs and sugars and repeat FLP and ALT in 8 weeks. ------  Notes recorded by Levin Bacon, RPH on 07/28/2018 at 9:27 AM EDT LDL is at goal <70. TG still elevated, but improved from previous panel. Would recommend avoiding alcohol, decreasing carbs and sugars. Could consider addition of Vascepa 2g BID as well if not cost prohibitive.

## 2018-08-24 DIAGNOSIS — E785 Hyperlipidemia, unspecified: Secondary | ICD-10-CM

## 2018-08-25 NOTE — Telephone Encounter (Signed)
Patient confirmed fasting lab appointment for 12/19.

## 2018-09-26 ENCOUNTER — Other Ambulatory Visit: Payer: Self-pay | Admitting: Cardiology

## 2018-10-04 NOTE — Telephone Encounter (Signed)
Left the patient a detailed voicemail about his MyChart message. Advised to call our office if he wanted to discuss further.

## 2018-10-20 ENCOUNTER — Other Ambulatory Visit: Payer: Medicare Other

## 2018-10-20 DIAGNOSIS — E785 Hyperlipidemia, unspecified: Secondary | ICD-10-CM

## 2018-10-21 LAB — LIPID PANEL
CHOLESTEROL TOTAL: 114 mg/dL (ref 100–199)
Chol/HDL Ratio: 3.9 ratio (ref 0.0–5.0)
HDL: 29 mg/dL — ABNORMAL LOW (ref >39)
LDL CALC: 53 mg/dL (ref 0–99)
TRIGLYCERIDES: 160 mg/dL — AB (ref 0–149)
VLDL Cholesterol Cal: 32 mg/dL (ref 5–40)

## 2018-10-21 LAB — ALT: ALT: 27 IU/L (ref 0–44)

## 2018-10-25 NOTE — Telephone Encounter (Signed)
Will call at patient requested time.

## 2018-10-31 ENCOUNTER — Telehealth: Payer: Self-pay

## 2018-10-31 DIAGNOSIS — E785 Hyperlipidemia, unspecified: Secondary | ICD-10-CM

## 2018-10-31 MED ORDER — ROSUVASTATIN CALCIUM 20 MG PO TABS: 20.0000 mg | ORAL_TABLET | Freq: Every day | ORAL | 3 refills | Status: DC

## 2018-10-31 NOTE — Telephone Encounter (Signed)
Notes recorded by Levin BaconAuten, Kelley M, RPH on 10/21/2018 at 2:16 PM EST Would recommend change to rosuvastatin 20mg  daily (stop simvastatin) as I do not see that he has tried this and repeat lipid/LFTs in 8-12 weeks. Would also recommend decreasing sugars and carbs as well as avoiding alcohol. ------  Notes recorded by Dustin FlockKordsmeier, Terika Pillard G, RN on 10/21/2018 at 2:09 PM EST Patient has not taken Vascepa due to it upsetting his Interstitial Cystitis. ------  Notes recorded by Levin BaconAuten, Kelley M, RPH on 10/21/2018 at 1:57 PM EST Pt has vascepa 2g BID on his profile. Would ensure that pt is actually taking. LDL is at goal, but if adherent to Vascepa would recommend changing to more potent statin for TG lowering (he has tried atorvastatin 80mg ) would recommend stop simvastatin and start rosuvastatin 20mg  once daily. Repeat lipid panel in 8-12 weeks. ------

## 2018-10-31 NOTE — Telephone Encounter (Signed)
Spoke with the patient, he accepted taking Crestor 20 mg, daily and he scheduled 01/02/19 for fasting labs.

## 2018-10-31 NOTE — Telephone Encounter (Signed)
-----   Message from Quintella Reichertraci R Turner, MD sent at 10/21/2018  8:07 PM EST ----- Agree with recommendations - Joshua Rojas can you please call patient with plan

## 2018-12-22 ENCOUNTER — Other Ambulatory Visit: Payer: Self-pay | Admitting: Cardiology

## 2019-01-02 ENCOUNTER — Other Ambulatory Visit: Payer: Medicare Other | Admitting: *Deleted

## 2019-01-02 DIAGNOSIS — E785 Hyperlipidemia, unspecified: Secondary | ICD-10-CM

## 2019-01-03 LAB — HEPATIC FUNCTION PANEL
ALBUMIN: 4.1 g/dL (ref 4.0–5.0)
ALK PHOS: 79 IU/L (ref 39–117)
ALT: 23 IU/L (ref 0–44)
AST: 24 IU/L (ref 0–40)
BILIRUBIN TOTAL: 0.4 mg/dL (ref 0.0–1.2)
BILIRUBIN, DIRECT: 0.12 mg/dL (ref 0.00–0.40)
TOTAL PROTEIN: 6.4 g/dL (ref 6.0–8.5)

## 2019-01-03 LAB — LIPID PANEL
CHOL/HDL RATIO: 3.8 ratio (ref 0.0–5.0)
Cholesterol, Total: 121 mg/dL (ref 100–199)
HDL: 32 mg/dL — ABNORMAL LOW (ref >39)
LDL Calculated: 47 mg/dL (ref 0–99)
Triglycerides: 211 mg/dL — ABNORMAL HIGH (ref 0–149)
VLDL Cholesterol Cal: 42 mg/dL — ABNORMAL HIGH (ref 5–40)

## 2019-01-26 DIAGNOSIS — E785 Hyperlipidemia, unspecified: Secondary | ICD-10-CM

## 2019-01-30 NOTE — Telephone Encounter (Signed)
The patient stopped Vascepa on 10/21/18, because it caused him pain with his interstitial cystitis.

## 2019-01-31 MED ORDER — ROSUVASTATIN CALCIUM 40 MG PO TABS: 40.0000 mg | ORAL_TABLET | Freq: Every day | ORAL | 3 refills | Status: DC

## 2019-02-20 ENCOUNTER — Other Ambulatory Visit: Payer: Self-pay | Admitting: Cardiology

## 2019-04-24 ENCOUNTER — Other Ambulatory Visit: Payer: Medicare Other

## 2019-05-19 ENCOUNTER — Other Ambulatory Visit: Payer: Self-pay | Admitting: Cardiology

## 2019-06-14 ENCOUNTER — Other Ambulatory Visit: Payer: Self-pay | Admitting: Cardiology

## 2019-07-03 ENCOUNTER — Other Ambulatory Visit: Payer: Self-pay | Admitting: Cardiology

## 2019-07-28 ENCOUNTER — Other Ambulatory Visit: Payer: Self-pay | Admitting: Cardiology

## 2019-08-21 ENCOUNTER — Other Ambulatory Visit: Payer: Self-pay

## 2019-08-21 ENCOUNTER — Encounter: Payer: Self-pay | Admitting: Cardiology

## 2019-08-21 ENCOUNTER — Encounter: Payer: Self-pay | Admitting: Nurse Practitioner

## 2019-08-21 ENCOUNTER — Ambulatory Visit (INDEPENDENT_AMBULATORY_CARE_PROVIDER_SITE_OTHER): Payer: Medicare Other | Admitting: Cardiology

## 2019-08-21 VITALS — BP 120/70 | HR 63 | Ht 71.0 in | Wt 283.0 lb

## 2019-08-21 DIAGNOSIS — I2510 Atherosclerotic heart disease of native coronary artery without angina pectoris: Secondary | ICD-10-CM

## 2019-08-21 DIAGNOSIS — K219 Gastro-esophageal reflux disease without esophagitis: Secondary | ICD-10-CM

## 2019-08-21 DIAGNOSIS — E785 Hyperlipidemia, unspecified: Secondary | ICD-10-CM

## 2019-08-21 DIAGNOSIS — I10 Essential (primary) hypertension: Secondary | ICD-10-CM

## 2019-08-21 DIAGNOSIS — R0602 Shortness of breath: Secondary | ICD-10-CM

## 2019-08-21 MED ORDER — PANTOPRAZOLE SODIUM 40 MG PO TBEC: 40.0000 mg | DELAYED_RELEASE_TABLET | Freq: Every day | ORAL | 3 refills | Status: DC

## 2019-08-21 NOTE — Progress Notes (Signed)
Cardiology Office Note:    Date:  08/21/2019   ID:  Joshua Rojas, DOB [birth year], MRN [id number]  PCP:  Joshua Rojas, David, MD  Cardiologist:  No primary care provider on file.    Referring MD: Joshua Rojas, David, MD   Chief Complaint  Patient presents with  . Coronary Artery Disease  . Hypertension  . Hyperlipidemia    History of Present Illness:    Joshua AsalJoel Gann is a 53 y.o. male with a hx of CAD with remote MI andPCI in 2011 with readmission for 2019 with chest pain and NSTEMI Troponin level peaked at 4.58. Cath showed 95% proximal LAD, 80% D1 and occluded OM 2 and underwent PCI/drug-eluting stent x1 to the mid LAD with jailing of a small caliber 80% diagonal. Left ventricular EF was normal by cath and and echo at 60 to 65%. He was started on DAPT with aspirin and Brilinta and also on Imdur 30 mg daily. He was also placed on high-dose statin therapy atorvastatin.  Unfortunately he developed shortness of breath in which transition from Brilinta to Plavix.  He also felt that he was not tolerating Lipitor as well as he tolerated simvastatin he was changed back to simvastatin 40 mg daily.  Additional history includes hypertension, anxiety and depression.   he is here today for followup and is doing well.  He denies any chest pain or pressure, PND, orthopnea, LE edema, dizziness, palpitations or syncope. He has gained weight and now has noticed increased SOB.  He has also been very sedentary.  He is compliant with his meds and is tolerating meds with no SE.    Past Medical History:  Diagnosis Date  . Coronary artery disease    s/p remote 1997 and subsequent PCI, remote MI 2011 and s/p NSTEMI 2019 with cath showing 95% proximal LAD, 80% D1 and occluded OM 2 and underwent PCI/drug-eluting stent x1 to the mid LAD with jailing of a small caliber 80% diagonal  . GERD (gastroesophageal reflux disease)   . H/O heart artery stent   . Hyperlipidemia LDL goal <70   . Hypertension   . Interstitial  cystitis   . MI, old     Past Surgical History:  Procedure Laterality Date  . CARDIAC CATHETERIZATION    . CARDIAC CATHETERIZATION  02/28/2018  . CHOLECYSTECTOMY    . CORONARY ANGIOPLASTY    . CORONARY STENT INTERVENTION  02/28/2018  . CORONARY STENT INTERVENTION N/A 02/28/2018   Procedure: CORONARY STENT INTERVENTION;  Surgeon: Lennette BihariKelly, Thomas A, MD;  [facility];  Service: Cardiovascular;  Laterality: N/A;  . LEFT HEART CATH AND CORONARY ANGIOGRAPHY N/A 02/28/2018   Procedure: LEFT HEART CATH AND CORONARY ANGIOGRAPHY;  Surgeon: Lennette BihariKelly, Thomas A, MD;  Location: [facility];  Service: Cardiovascular;  Laterality: N/A;    Current Medications: Current Meds  Medication Sig  . ALPRAZolam (XANAX) 1 MG tablet Take 0.5-2 mg by mouth See admin instructions. Take 0.5 mg by mouth two times a day and 2 mg at bedtime  . amitriptyline (ELAVIL) 25 MG tablet Take 25 mg by mouth daily.  Marland Kitchen. aspirin EC 81 MG tablet Take 81 mg by mouth daily.   Marland Kitchen. azelastine (ASTELIN) 0.1 % nasal spray Place 2 sprays into both nostrils daily as needed for rhinitis or allergies.   . citalopram (CELEXA) 40 MG tablet Take 40 mg by mouth at bedtime.   . clopidogrel (PLAVIX) 75 MG tablet Take 1 tablet (75 mg total) by mouth daily. Please keep  upcoming appt in October with Dr. Radford Pax for future refills. Thank you  . ezetimibe (ZETIA) 10 MG tablet TAKE 1 TABLET BY MOUTH EVERY DAY  . fluticasone (FLONASE) 50 MCG/ACT nasal spray Place 2 sprays into both nostrils daily as needed for allergies or rhinitis.   Marland Kitchen gabapentin (NEURONTIN) 600 MG tablet Take 600 mg by mouth 3 (three) times daily.  Marland Kitchen HYDROcodone-acetaminophen (NORCO) 10-325 MG per tablet Take 0.5-1 tablets by mouth See admin instructions. Take 1 tablet by mouth three times a day and may take an additional 0.5-1 tablet daily as needed  . hydrOXYzine (ATARAX/VISTARIL) 25 MG tablet Take 25 mg by mouth at bedtime.   . metoprolol succinate (TOPROL-XL) 50 MG 24  hr tablet Take 1 tablet (50 mg total) by mouth daily. Take with or immediately following a meal.  . nitroGLYCERIN (NITROSTAT) 0.4 MG SL tablet Place 1 tablet (0.4 mg total) under the tongue every 5 (five) minutes as needed for chest pain.  Marland Kitchen omeprazole (PRILOSEC) 20 MG capsule Take 20 mg by mouth daily with breakfast.  . rosuvastatin (CRESTOR) 40 MG tablet Take 1 tablet (40 mg total) by mouth daily.  . sodium chloride (OCEAN) 0.65 % nasal spray Place 1 spray into the nose 3 (three) times daily as needed for congestion.   . Tamsulosin HCl (FLOMAX) 0.4 MG CAPS Take 0.4 mg by mouth at bedtime.   Marland Kitchen tiZANidine (ZANAFLEX) 4 MG tablet Take 1 tablet by mouth 3 (three) times daily.  . [DISCONTINUED] simvastatin (ZOCOR) 40 MG tablet TAKE 1 TABLET BY MOUTH EVERY DAY     Allergies:   Benadryl [diphenhydramine hcl], Sudafed [pseudoephedrine], and Triprolidine-pse   Social History   Socioeconomic History  . Marital status: Single    Spouse name: Not on file  . Number of children: Not on file  . Years of education: Not on file  . Highest education level: Not on file  Occupational History  . Not on file  Social Needs  . Financial resource strain: Not on file  . Food insecurity    Worry: Not on file    Inability: Not on file  . Transportation needs    Medical: Not on file    Non-medical: Not on file  Tobacco Use  . Smoking status: Current Every Day Smoker    Packs/day: 1.00    Years: 35.00    Pack years: 35.00    Types: Cigarettes  . Smokeless tobacco: Never Used  Substance and Sexual Activity  . Alcohol use: No  . Drug use: No  . Sexual activity: Not Currently  Lifestyle  . Physical activity    Days per week: Not on file    Minutes per session: Not on file  . Stress: Not on file  Relationships  . Social Herbalist on phone: Not on file    Gets together: Not on file    Attends religious service: Not on file    Active member of club or organization: Not on file    Attends  meetings of clubs or organizations: Not on file    Relationship status: Not on file  Other Topics Concern  . Not on file  Social History Narrative  . Not on file     Family History: The patient's family history includes Diabetes in his father.  ROS:   Please see the history of present illness.    ROS  All other systems reviewed and negative.   EKGs/Labs/Other Studies Reviewed:  The following studies were reviewed today: none  EKG:  EKG is  ordered today.  The ekg ordered today demonstrates NSR at 63bpm with no ST changes  Recent Labs: 01/02/2019: ALT 23   Recent Lipid Panel    Component Value Date/Time   CHOL 121 01/02/2019 1614   TRIG 211 (H) 01/02/2019 1614   HDL 32 (L) 01/02/2019 1614   CHOLHDL 3.8 01/02/2019 1614   CHOLHDL 4.4 02/26/2018 0558   VLDL 33 02/26/2018 0558   LDLCALC 47 01/02/2019 1614    Physical Exam:    VS:  BP 120/70   Pulse 63   Ht 5\' 11"  (1.803 m)   Wt 283 lb (128.4 kg)   BMI 39.47 kg/m     Wt Readings from Last 3 Encounters:  08/21/19 283 lb (128.4 kg)  06/13/18 264 lb 9.6 oz (120 kg)  03/30/18 254 lb 9.6 oz (115.5 kg)     GEN:  Well nourished, well developed in no acute distress HEENT: Normal NECK: No JVD; No carotid bruits LYMPHATICS: No lymphadenopathy CARDIAC: RRR, no murmurs, rubs, gallops RESPIRATORY:  Clear to auscultation without rales, wheezing or rhonchi  ABDOMEN: Soft, non-tender, non-distended MUSCULOSKELETAL:  No edema; No deformity  SKIN: Warm and dry NEUROLOGIC:  Alert and oriented x 3 PSYCHIATRIC:  Normal affect   ASSESSMENT:    1. Coronary artery disease involving native coronary artery of native heart without angina pectoris   2. Essential hypertension   3. Hyperlipidemia LDL goal <70   4. Gastroesophageal reflux disease without esophagitis    PLAN:    In order of problems listed above:  1.  ASCAD - s/p remote MI andPCI in 2011 with readmission for 2019 with chest pain and NSTEMI with troponin level  peaked at 4.58. Cath showed 95% proximal LAD, 80% D1 and occluded OM 2 and underwent PCI/drug-eluting stent x1 to the mid LAD with jailing of a small caliber 80% diagonal. -denies any anginal chest pain but has had DOE recently but I suspect it is from weight gain and deconditioning -Lexiscan myoview to make sure there is adequate blood flow in the LAD that had PCI a year ago -continue ASA 81mg  daily, Plavix 75mg  daily, BB and statin  2.  HTN -BP controlled on exam today -continue Toprol XL 50mg  daily  3.  HLD -LDL goal is < 70 -continue Crestor 40mg  and Zetia 10mg  daily -LDL was 47 in March  4.  GERD -he is currently on Prilosec which can interfere with Plavix so I have told him to stop Prilosec and start Protonix 40mg  daily   Medication Adjustments/Labs and Tests Ordered: Current medicines are reviewed at length with the patient today.  Concerns regarding medicines are outlined above.  No orders of the defined types were placed in this encounter.  No orders of the defined types were placed in this encounter.   Signed, , MD  08/21/2019 4:39 PM    [facility]

## 2019-08-21 NOTE — Patient Instructions (Signed)
Medication Instructions:  Your physician has recommended you make the following change in your medication:  STOP Omeprazole START Protonix (Pantoprazole) 40 mg once daily  *If you need a refill on your cardiac medications before your next appointment, please call your pharmacy*  Lab Work: None Ordered   Testing/Procedures: Your physician has requested that you have a lexiscan myoview. For further information please visit [email or web address]. Please follow instruction sheet, as given.    Follow-Up: At [facility], you and your health needs are our priority.  As part of our continuing mission to provide you with exceptional heart care, we have created designated Provider Care Teams.  These Care Teams include your primary Cardiologist (physician) and Advanced Practice Providers (APPs -  Physician Assistants and Nurse Practitioners) who all work together to provide you with the care you need, when you need it.  Your next appointment:   12 months  The format for your next appointment:   Either In Person or Virtual  Provider:   You may see Dr. Radford Pax or one of the following Advanced Practice Providers on your designated Care Team:    Melina Copa, PA-C  Ermalinda Barrios, PA-C

## 2019-08-24 NOTE — Addendum Note (Signed)
Addended by: Emmaline Life on: 08/24/2019 09:31 AM   Modules accepted: Orders

## 2019-08-31 ENCOUNTER — Other Ambulatory Visit: Payer: Self-pay | Admitting: Cardiology

## 2019-09-12 ENCOUNTER — Ambulatory Visit (HOSPITAL_COMMUNITY): Payer: Medicare Other

## 2019-09-13 ENCOUNTER — Ambulatory Visit (HOSPITAL_COMMUNITY): Payer: Medicare Other

## 2019-10-30 ENCOUNTER — Other Ambulatory Visit: Payer: Self-pay | Admitting: Cardiology

## 2019-12-21 ENCOUNTER — Ambulatory Visit: Payer: Medicare Other | Admitting: Cardiology

## 2020-01-05 ENCOUNTER — Other Ambulatory Visit: Payer: Self-pay | Admitting: Cardiology

## 2020-01-05 MED ORDER — CLOPIDOGREL BISULFATE 75 MG PO TABS: 75.0000 mg | ORAL_TABLET | Freq: Every day | ORAL | 1 refills | Status: DC

## 2020-01-05 NOTE — Telephone Encounter (Signed)
New message   *STAT* If patient is at the pharmacy, call can be transferred to refill team.   1. Which medications need to be refilled? (please list name of each medication and dose if known) clopidogrel (PLAVIX) 75 MG tablet  2. Which pharmacy/location (including street and city if local pharmacy) is medication to be sent to? [id number] - [place], [place] - [address]  3. Do they need a 30 day or 90 day supply? 30 day   Needs enough medication until his appointment on 01/26/2020 at 8:20am.

## 2020-01-25 NOTE — Progress Notes (Signed)
Virtual Visit via Telephone Note   This visit type was conducted due to national recommendations for restrictions regarding the COVID-19 Pandemic (e.g. social distancing) in an effort to limit this patient's exposure and mitigate transmission in our community.  Due to his co-morbid illnesses, this patient is at least at moderate risk for complications without adequate follow up.  This format is felt to be most appropriate for this patient at this time.  The patient did not have access to video technology/had technical difficulties with video requiring transitioning to audio format only (telephone).  All issues noted in this document were discussed and addressed.  No physical exam could be performed with this format.  Please refer to the patient's chart for his  consent to telehealth for [facility].   Evaluation Performed:  Follow-up visit  This visit type was conducted due to national recommendations for restrictions regarding the COVID-19 Pandemic (e.g. social distancing).  This format is felt to be most appropriate for this patient at this time.  All issues noted in this document were discussed and addressed.  No physical exam was performed (except for noted visual exam findings with Video Visits).  Please refer to the patient's chart (MyChart message for video visits and phone note for telephone visits) for the patient's consent to telehealth for [facility].  Date:  01/26/2020   ID:  Joshua Rojas, DOB [birth year], MRN [id number]  Patient Location:  Home  Provider location:   [place]  PCP:  Tracey Harries, MD  Cardiologist:  Armanda Magic, MD  Electrophysiologist:  None   Chief Complaint:  CAD, HTN, HLD  History of Present Illness:    Joshua Rojas is a 54 y.o. male who presents via audio/video conferencing for a telehealth visit today.    [name] is a 54 y.o. male with a hx of CAD with remote MI andPCI in 2011with readmission for 2019 with chest pain and NSTEMITroponin  level peaked at 4.58.Cath showed95% proximal LAD, 80% D1 and occluded OM 2 and underwentPCI/drug-eluting stent x1 to the mid LAD with jailing of a small caliber 80% diagonal. Left ventricular EF was normal bycath andand echo at 60 to 65%. Hewas started on DAPT withaspirin and Brilinta and also onImdur 30 mg daily. He was also placed on high-dose statin therapyatorvastatin.Unfortunately he developed shortness of breath in prompting transition from Brilinta to Plavix. He also felt that he was not tolerating Lipitor as well as he tolerated simvastatin he was changed back to simvastatin 40.  He is here today for followup and is doing well.  He denies any chest pain or pressure, PND, orthopnea, LE edema, dizziness, palpitations or syncope. He has chronic DOE related to obesity. He was recently dx with pre DM and has changed his diet.  He is compliant with his meds and is tolerating meds with no SE.    The patient does not have symptoms concerning for COVID-19 infection (fever, chills, cough, or new shortness of breath).    Prior CV studies:   The following studies were reviewed today:  None  Past Medical History:  Diagnosis Date  . Coronary artery disease    s/p remote 1997 and subsequent PCI, remote MI 2011 and s/p NSTEMI 2019 with cath showing 95% proximal LAD, 80% D1 and occluded OM 2 and underwent PCI/drug-eluting stent x1 to the mid LAD with jailing of a small caliber 80% diagonal  . GERD (gastroesophageal reflux disease)   . H/O heart artery stent   . Hyperlipidemia LDL goal <  34   . Hypertension   . Interstitial cystitis   . MI, old    Past Surgical History:  Procedure Laterality Date  . CARDIAC CATHETERIZATION    . CARDIAC CATHETERIZATION  02/28/2018  . CHOLECYSTECTOMY    . CORONARY ANGIOPLASTY    . CORONARY STENT INTERVENTION  02/28/2018  . CORONARY STENT INTERVENTION N/A 02/28/2018   Procedure: CORONARY STENT INTERVENTION;  Surgeon: Troy Sine, MD;  Location: [address] CV LAB;  Service: Cardiovascular;  Laterality: N/A;  . LEFT HEART CATH AND CORONARY ANGIOGRAPHY N/A 02/28/2018   Procedure: LEFT HEART CATH AND CORONARY ANGIOGRAPHY;  Surgeon: Troy Sine, MD;  Location: [place] CV [facility];  Service: Cardiovascular;  Laterality: N/A;     Current Meds  Medication Sig  . ALPRAZolam (XANAX) 1 MG tablet Take 0.5-2 mg by mouth See admin instructions. Take 0.5 mg by mouth two times a day and 2 mg at bedtime  . amitriptyline (ELAVIL) 25 MG tablet Take 25 mg by mouth daily.  Marland Kitchen aspirin EC 81 MG tablet Take 81 mg by mouth daily.   Marland Kitchen azelastine (ASTELIN) 0.1 % nasal spray Place 2 sprays into both nostrils daily as needed for rhinitis or allergies.   . citalopram (CELEXA) 40 MG tablet Take 40 mg by mouth at bedtime.   . clopidogrel (PLAVIX) 75 MG tablet Take 1 tablet (75 mg total) by mouth daily.  . fluticasone (FLONASE) 50 MCG/ACT nasal spray Place 2 sprays into both nostrils daily as needed for allergies or rhinitis.   Marland Kitchen gabapentin (NEURONTIN) 600 MG tablet Take 600 mg by mouth 3 (three) times daily.  Marland Kitchen HYDROcodone-acetaminophen (NORCO) 10-325 MG per tablet Take 0.5-1 tablets by mouth See admin instructions. Take 1 tablet by mouth three times a day and may take an additional 0.5-1 tablet daily as needed  . hydrOXYzine (ATARAX/VISTARIL) 25 MG tablet Take 25 mg by mouth at bedtime.   . metoprolol succinate (TOPROL-XL) 50 MG 24 hr tablet Take 1 tablet (50 mg total) by mouth daily. Take with or immediately following a meal.  . nitroGLYCERIN (NITROSTAT) 0.4 MG SL tablet Place 1 tablet (0.4 mg total) under the tongue every 5 (five) minutes as needed for chest pain.  . pantoprazole (PROTONIX) 40 MG tablet Take 1 tablet (40 mg total) by mouth daily.  . rosuvastatin (CRESTOR) 40 MG tablet TAKE 1 TABLET BY MOUTH EVERY DAY  . sodium chloride (OCEAN) 0.65 % nasal spray Place 1 spray into the nose 3 (three) times daily as needed for congestion.   . Tamsulosin HCl (FLOMAX)  0.4 MG CAPS Take 0.4 mg by mouth at bedtime.   Marland Kitchen tiZANidine (ZANAFLEX) 4 MG tablet Take 1 tablet by mouth 3 (three) times daily.     Allergies:   Benadryl [diphenhydramine hcl], Sudafed [pseudoephedrine], and Triprolidine-pse   Social History   Tobacco Use  . Smoking status: Current Every Day Smoker    Packs/day: 1.00    Years: 35.00    Pack years: 35.00    Types: Cigarettes  . Smokeless tobacco: Never Used  Substance Use Topics  . Alcohol use: No  . Drug use: No     Family Hx: The patient's family history includes Diabetes in his father.  ROS:   Please see the history of present illness.     All other systems reviewed and are negative.   Labs/Other Tests and Data Reviewed:    Recent Labs: No results found for requested labs within last 8760 hours.  Recent Lipid Panel Lab Results  Component Value Date/Time   CHOL 121 01/02/2019 04:14 PM   TRIG 211 (H) 01/02/2019 04:14 PM   HDL 32 (L) 01/02/2019 04:14 PM   CHOLHDL 3.8 01/02/2019 04:14 PM   CHOLHDL 4.4 02/26/2018 05:58 AM   LDLCALC 47 01/02/2019 04:14 PM    Wt Readings from Last 3 Encounters:  01/26/20 288 lb (130.6 kg)  08/21/19 283 lb (128.4 kg)  06/13/18 264 lb 9.6 oz (120 kg)     Objective:    Vital Signs:  Ht 5\' 11"  (1.803 m)   Wt 288 lb (130.6 kg)   BMI 40.17 kg/m     ASSESSMENT & PLAN:    1.  ASCAD - s/p remote MI andPCI in 2011with readmission for 2019 with chest pain and NSTEMIwith troponin level peaked at 4.58.Cath showed95% proximal LAD, 80% D1 and occluded OM 2 and underwentPCI/drug-eluting stent x1 to the mid LAD with jailing of a small caliber 80% diagonal. -since I saw him last he has been doing well with no CP or SOB -continue ASA, Plavix 75mg  daily, BB and statin  2.  HTN -BP well controlled -continue Toprol XL 50mg  daily  3.  HLD -LDL goal is < 70 -continue Crestor 40mg  daily  -I will get a copy of his FLP and ALT that was just done from his PCP  4.  GERD -continue  Protonix 40mg  daily  COVID-19 Education: The signs and symptoms of COVID-19 were discussed with the patient and how to seek care for testing (follow up with PCP or arrange E-visit).  The importance of social distancing was discussed today.  Patient Risk:   After full review of this patient's clinical status, I feel that they are at least moderate risk at this time.  Time:   Today, I have spent 15 minutes on telemedicine discussing medical problems including CAD, HTN, HLD, GERD and reviewing patient's chart including prior labs and prior OV notes.  Medication Adjustments/Labs and Tests Ordered: Current medicines are reviewed at length with the patient today.  Concerns regarding medicines are outlined above.  Tests Ordered: No orders of the defined types were placed in this encounter.  Medication Changes: No orders of the defined types were placed in this encounter.   Disposition:  Follow up in 1 year(s)  Signed, 2020, MD  01/26/2020 8:07 AM    [facility]

## 2020-01-26 ENCOUNTER — Telehealth (INDEPENDENT_AMBULATORY_CARE_PROVIDER_SITE_OTHER): Payer: Medicare Other | Admitting: Cardiology

## 2020-01-26 ENCOUNTER — Telehealth: Payer: Self-pay

## 2020-01-26 ENCOUNTER — Encounter: Payer: Self-pay | Admitting: Cardiology

## 2020-01-26 ENCOUNTER — Other Ambulatory Visit: Payer: Self-pay

## 2020-01-26 VITALS — Ht 71.0 in | Wt 288.0 lb

## 2020-01-26 DIAGNOSIS — K219 Gastro-esophageal reflux disease without esophagitis: Secondary | ICD-10-CM

## 2020-01-26 DIAGNOSIS — E785 Hyperlipidemia, unspecified: Secondary | ICD-10-CM

## 2020-01-26 DIAGNOSIS — I10 Essential (primary) hypertension: Secondary | ICD-10-CM

## 2020-01-26 DIAGNOSIS — I2510 Atherosclerotic heart disease of native coronary artery without angina pectoris: Secondary | ICD-10-CM

## 2020-01-26 MED ORDER — ROSUVASTATIN CALCIUM 40 MG PO TABS: 40.0000 mg | ORAL_TABLET | Freq: Every day | ORAL | 3 refills | Status: DC

## 2020-01-26 MED ORDER — CLOPIDOGREL BISULFATE 75 MG PO TABS: 75.0000 mg | ORAL_TABLET | Freq: Every day | ORAL | 3 refills | Status: DC

## 2020-01-26 MED ORDER — METOPROLOL SUCCINATE ER 50 MG PO TB24: 50.0000 mg | ORAL_TABLET | Freq: Every day | ORAL | 3 refills | Status: DC

## 2020-01-26 NOTE — Telephone Encounter (Signed)
  Patient Consent for Virtual Visit         Joshua Rojas has provided verbal consent on 01/26/2020 for a virtual visit (video or telephone).   CONSENT FOR VIRTUAL VISIT FOR:  Joshua Rojas  By participating in this virtual visit I agree to the following:  I hereby voluntarily request, consent and authorize [facility] and its employed or contracted physicians, physician assistants, nurse practitioners or other licensed health care professionals (the Practitioner), to provide me with telemedicine health care services (the "Services") as deemed necessary by the treating Practitioner. I acknowledge and consent to receive the Services by the Practitioner via telemedicine. I understand that the telemedicine visit will involve communicating with the Practitioner through live audiovisual communication technology and the disclosure of certain medical information by electronic transmission. I acknowledge that I have been given the opportunity to request an in-person assessment or other available alternative prior to the telemedicine visit and am voluntarily participating in the telemedicine visit.  I understand that I have the right to withhold or withdraw my consent to the use of telemedicine in the course of my care at any time, without affecting my right to future care or treatment, and that the Practitioner or I may terminate the telemedicine visit at any time. I understand that I have the right to inspect all information obtained and/or recorded in the course of the telemedicine visit and may receive copies of available information for a reasonable fee.  I understand that some of the potential risks of receiving the Services via telemedicine include:  Marland Kitchen Delay or interruption in medical evaluation due to technological equipment failure or disruption; . Information transmitted may not be sufficient (e.g. poor resolution of images) to allow for appropriate medical decision making by the Practitioner; and/or    . In rare instances, security protocols could fail, causing a breach of personal health information.  Furthermore, I acknowledge that it is my responsibility to provide information about my medical history, conditions and care that is complete and accurate to the best of my ability. I acknowledge that Practitioner's advice, recommendations, and/or decision may be based on factors not within their control, such as incomplete or inaccurate data provided by me or distortions of diagnostic images or specimens that may result from electronic transmissions. I understand that the practice of medicine is not an exact science and that Practitioner makes no warranties or guarantees regarding treatment outcomes. I acknowledge that a copy of this consent can be made available to me via my patient portal [facility] MyChart), or I can request a printed copy by calling the office of [facility].    I understand that my insurance will be billed for this visit.   I have read or had this consent read to me. . I understand the contents of this consent, which adequately explains the benefits and risks of the Services being provided via telemedicine.  . I have been provided ample opportunity to ask questions regarding this consent and the Services and have had my questions answered to my satisfaction. . I give my informed consent for the services to be provided through the use of telemedicine in my medical care

## 2020-01-26 NOTE — Patient Instructions (Addendum)
Medication Instructions:  Your physician recommends that you continue on your current medications as directed. Please refer to the Current Medication list given to you today.  *If you need a refill on your cardiac medications before your next appointment, please call your pharmacy*  Follow-Up: At [facility], you and your health needs are our priority.  As part of our continuing mission to provide you with exceptional heart care, we have created designated Provider Care Teams.  These Care Teams include your primary Cardiologist (physician) and Advanced Practice Providers (APPs -  Physician Assistants and Nurse Practitioners) who all work together to provide you with the care you need, when you need it.   Your next appointment:   1 year(s)  The format for your next appointment:   In Person  Provider:   Armanda Magic, MD  Other Instructions: You have been referred to the Lipid Clinic to see our PharmD.

## 2020-01-26 NOTE — Addendum Note (Signed)
Addended by: Theresia Majors on: 01/26/2020 09:39 AM   Modules accepted: Orders

## 2020-01-26 NOTE — Addendum Note (Signed)
Addended by: Theresia Majors on: 01/26/2020 08:22 AM   Modules accepted: Orders

## 2020-02-20 ENCOUNTER — Ambulatory Visit (INDEPENDENT_AMBULATORY_CARE_PROVIDER_SITE_OTHER): Payer: Medicare Other | Admitting: Pharmacist

## 2020-02-20 ENCOUNTER — Other Ambulatory Visit: Payer: Self-pay

## 2020-02-20 DIAGNOSIS — E7800 Pure hypercholesterolemia, unspecified: Secondary | ICD-10-CM | POA: Diagnosis not present

## 2020-02-20 DIAGNOSIS — I2510 Atherosclerotic heart disease of native coronary artery without angina pectoris: Secondary | ICD-10-CM

## 2020-02-20 DIAGNOSIS — Z716 Tobacco abuse counseling: Secondary | ICD-10-CM | POA: Diagnosis not present

## 2020-02-20 MED ORDER — FENOFIBRATE 145 MG PO TABS: 145.0000 mg | ORAL_TABLET | Freq: Every day | ORAL | 3 refills | Status: DC

## 2020-02-20 NOTE — Progress Notes (Signed)
Patient ID: Harvest Stanco                 DOB: 1966/02/08                    MRN: [id number]     HPI: Joshua Rojas is a 54 y.o. male patient referred to lipid clinic by Dr. Mayford Knife. PMH is significant for CAD with remote MI andPCI in 2011with readmission for 2019 with chest pain and NSTEMITroponin level peaked at 4.58.Cath showed95% proximal LAD, 80% D1 and occluded OM 2 and underwentPCI/drug-eluting stent x1 to the mid LAD with jailing of a small caliber 80% diagonal. Left ventricular EF was normal bycath andand echo at 60 to 65%. Hewas started on DAPT withaspirin andBrilintaand also onImdur 30 mg daily. He was also placed on high-dose statin therapyatorvastatin.Unfortunately he developed shortness of breath in prompting transition from Brilinta to Plavix. He also felt that he was not tolerating Lipitor as well as he tolerated simvastatin he was changed back to simvastatin 40. Eventually changed to rosuvastatin 40mg .  Patient presents today to lipid clinic to discuss lipids. Patient states he has intersistial cystitis and this limits his food choices. He states that he starts the day off good, but then tends to eat a lot of sweets. He is currently smoking and stats that he knows logically he needs to quite, but that he just doesn't think he can do it right now. He has tried chantix twice but had suicidal ideation with it.  Patient is concerned about his blood sugar. Last A1C was 6.1.  Current Medications: rosuvastatin 40mg  daily Intolerances: atorvastatin Risk Factors: ASCVD LDL goal: <55 due to progressive ASCVD, TG <150  Diet: dinner: black-beans, mushrooms, corn, rice, dayia (rice based cheese) Snacks: plain oatmeal with milk and blueberries, maza, almond dream ice-cream, carab, angel food cake Drinks water, 5-6oz glass of soda   Exercise: none  Family History: The patient's family history includes Diabetes in his father.  Social History: no ETOH, + tobacco  Labs:  01/10/2020: TC 110, TG 258, HDL 32, LDL 38  Past Medical History:  Diagnosis Date  . Coronary artery disease    s/p remote 1997 and subsequent PCI, remote MI 2011 and s/p NSTEMI 2019 with cath showing 95% proximal LAD, 80% D1 and occluded OM 2 and underwent PCI/drug-eluting stent x1 to the mid LAD with jailing of a small caliber 80% diagonal  . GERD (gastroesophageal reflux disease)   . H/O heart artery stent   . Hyperlipidemia LDL goal <70   . Hypertension   . Interstitial cystitis   . MI, old     Current Outpatient Medications on File Prior to Visit  Medication Sig Dispense Refill  . ALPRAZolam (XANAX) 1 MG tablet Take 0.5-2 mg by mouth See admin instructions. Take 0.5 mg by mouth two times a day and 2 mg at bedtime    . amitriptyline (ELAVIL) 25 MG tablet Take 25 mg by mouth daily.    2012 aspirin EC 81 MG tablet Take 81 mg by mouth daily.     2020 azelastine (ASTELIN) 0.1 % nasal spray Place 2 sprays into both nostrils daily as needed for rhinitis or allergies.   2  . citalopram (CELEXA) 40 MG tablet Take 40 mg by mouth at bedtime.     . clopidogrel (PLAVIX) 75 MG tablet Take 1 tablet (75 mg total) by mouth daily. 90 tablet 3  . ezetimibe (ZETIA) 10 MG tablet TAKE 1 TABLET BY MOUTH EVERY  DAY (Patient not taking: Reported on 01/26/2020) 90 tablet 1  . fluticasone (FLONASE) 50 MCG/ACT nasal spray Place 2 sprays into both nostrils daily as needed for allergies or rhinitis.   2  . gabapentin (NEURONTIN) 600 MG tablet Take 600 mg by mouth 3 (three) times daily.    Marland Kitchen HYDROcodone-acetaminophen (NORCO) 10-325 MG per tablet Take 0.5-1 tablets by mouth See admin instructions. Take 1 tablet by mouth three times a day and may take an additional 0.5-1 tablet daily as needed    . hydrOXYzine (ATARAX/VISTARIL) 25 MG tablet Take 25 mg by mouth at bedtime.     . metoprolol succinate (TOPROL-XL) 50 MG 24 hr tablet Take 1 tablet (50 mg total) by mouth daily. Take with or immediately following a meal. 90 tablet 3   . nitroGLYCERIN (NITROSTAT) 0.4 MG SL tablet Place 1 tablet (0.4 mg total) under the tongue every 5 (five) minutes as needed for chest pain. 25 tablet 3  . pantoprazole (PROTONIX) 40 MG tablet Take 1 tablet (40 mg total) by mouth daily. 90 tablet 3  . rosuvastatin (CRESTOR) 40 MG tablet Take 1 tablet (40 mg total) by mouth daily. 90 tablet 3  . sodium chloride (OCEAN) 0.65 % nasal spray Place 1 spray into the nose 3 (three) times daily as needed for congestion.     . Tamsulosin HCl (FLOMAX) 0.4 MG CAPS Take 0.4 mg by mouth at bedtime.     Marland Kitchen tiZANidine (ZANAFLEX) 4 MG tablet Take 1 tablet by mouth 3 (three) times daily.  11   No current facility-administered medications on file prior to visit.    Allergies  Allergen Reactions  . Benadryl [Diphenhydramine Hcl] Other (See Comments)    Makes interstitial cystitis worse  . Sudafed [Pseudoephedrine] Other (See Comments)    Makes interstitial cystitis worse  . Triprolidine-Pse Other (See Comments)    Makes interstitial cystitis worse    Assessment/Plan:  1. Hyperlipidemia - Patient LDL is at goal of <70. However TG are above goal of <150. Unfortunately, patient had medicaid and this is a non-preferred agent. Patient was educated on the importance of diet improvement. Recommended limiting sweets and cutting out soda from his diet. Will also start fenofibrate 145mg  daily. Educated on potential side effects. Will repeat labs in 3 months. Patient thinks he has labs set up for PCP office. He will call us if he needs to schedule in our office.  2. Tobacco Abuse- Patient educated on the products available to help him quite. Would avoid chantix in the future. Advised that Medicaid will pay for both wellbutin and nicotine replacement. He was encouraged to quite, but advised that we were here to help when he was ready.    Thank you,  Ramond Dial, Pharm.D, BCPS, CPP [place]  [address], [place], [place] [address]   Phone: [phone]; Fax: [phone]

## 2020-02-20 NOTE — Patient Instructions (Addendum)
It was a pleasure to meet you!  Please START taking fenofibrate 145mg  daily CONTINUE rosuvastatin 40mg  daily  Try to limit your sweets and quite drinking soda  Call at [phone] with any questions/concerns/ or if you are ready to quite smoking   We will plan to check labs in 3 months

## 2020-04-26 ENCOUNTER — Other Ambulatory Visit: Payer: Self-pay | Admitting: Cardiology

## 2020-04-26 DIAGNOSIS — R195 Other fecal abnormalities: Secondary | ICD-10-CM | POA: Insufficient documentation

## 2020-05-14 ENCOUNTER — Telehealth: Payer: Self-pay

## 2020-05-14 DIAGNOSIS — E7800 Pure hypercholesterolemia, unspecified: Secondary | ICD-10-CM

## 2020-05-14 NOTE — Telephone Encounter (Signed)
-----   Message from Olene Floss, RPH-CPP sent at 05/14/2020  7:27 AM EDT -----  ----- Message ----- From: Malena Peer D, RPH-CPP Sent: 05/14/2020 To: Olene Floss, RPH-CPP  Set up lipids- see if he got at PCP

## 2020-05-14 NOTE — Telephone Encounter (Signed)
CALLED LMOMED THE PT TO COMPLETE LIPID LABS

## 2020-05-18 DIAGNOSIS — M79604 Pain in right leg: Secondary | ICD-10-CM

## 2020-05-21 ENCOUNTER — Telehealth: Payer: Self-pay | Admitting: Pharmacist

## 2020-05-21 NOTE — Telephone Encounter (Signed)
Prior authorization denied for Vascepa. Patient has medicaid therefore I cannot see why it was denied. Will appeal the decision but need patient to sign a form that will come in his denial letter to allow [place] to appeal on his behalf. Called patient to update him- left message for him to call back.

## 2020-06-11 ENCOUNTER — Encounter: Payer: Self-pay | Admitting: Cardiology

## 2020-06-13 ENCOUNTER — Encounter (HOSPITAL_COMMUNITY): Payer: Self-pay

## 2020-06-18 ENCOUNTER — Other Ambulatory Visit (HOSPITAL_COMMUNITY): Payer: Self-pay | Admitting: Cardiology

## 2020-06-18 DIAGNOSIS — M79604 Pain in right leg: Secondary | ICD-10-CM

## 2020-06-18 DIAGNOSIS — M79605 Pain in left leg: Secondary | ICD-10-CM

## 2020-07-09 ENCOUNTER — Telehealth: Payer: Self-pay | Admitting: Cardiology

## 2020-07-09 ENCOUNTER — Ambulatory Visit (HOSPITAL_COMMUNITY)
Admission: RE | Admit: 2020-07-09 | Payer: Medicare Other | Source: Ambulatory Visit | Attending: Cardiology | Admitting: Cardiology

## 2020-07-09 NOTE — Telephone Encounter (Signed)
Left message for patient to call back  

## 2020-07-09 NOTE — Telephone Encounter (Signed)
New message:    Patient calling to cancel his up coming apt, but patient would like to speak with a nurse.

## 2020-07-31 ENCOUNTER — Ambulatory Visit (HOSPITAL_COMMUNITY)
Admission: RE | Admit: 2020-07-31 | Discharge: 2020-07-31 | Disposition: A | Discharge status: Home / Self Care | Payer: Medicare Other | Source: Ambulatory Visit | Attending: Cardiovascular Disease | Admitting: Cardiovascular Disease

## 2020-07-31 ENCOUNTER — Other Ambulatory Visit: Payer: Self-pay

## 2020-07-31 ENCOUNTER — Other Ambulatory Visit (HOSPITAL_COMMUNITY): Payer: Self-pay | Admitting: Cardiology

## 2020-07-31 DIAGNOSIS — M79605 Pain in left leg: Secondary | ICD-10-CM | POA: Diagnosis not present

## 2020-07-31 DIAGNOSIS — I739 Peripheral vascular disease, unspecified: Secondary | ICD-10-CM

## 2020-07-31 DIAGNOSIS — M79604 Pain in right leg: Secondary | ICD-10-CM | POA: Diagnosis present

## 2020-11-14 DIAGNOSIS — J3489 Other specified disorders of nose and nasal sinuses: Secondary | ICD-10-CM | POA: Insufficient documentation

## 2020-12-24 ENCOUNTER — Other Ambulatory Visit: Payer: Self-pay | Admitting: Cardiology

## 2021-01-01 ENCOUNTER — Telehealth: Payer: Self-pay | Admitting: Nurse Practitioner

## 2021-01-01 NOTE — Telephone Encounter (Signed)
Dr. Mayford Knife  Can you please address Plavix holding recommendations for this patient? He has a hx of remote MI andPCI in 2011, NSTEMIin 2019 LHC showing 95% proximal LAD, 80% D1 and occluded OM 2 and underwentPCI/drug-eluting stent x1 to the mid LAD with jailing of a small caliber 80% diagonal.   He is scheduled to undergo nasal valve collapse by ENT 01/06/21.   Please forward your recommendations to the pre-op pool  Thank you  Noreene Larsson

## 2021-01-01 NOTE — Telephone Encounter (Signed)
OK to hold Plavix for ENT surgery

## 2021-01-01 NOTE — Telephone Encounter (Signed)
   [facility] Pre-operative Risk Assessment    HEARTCARE STAFF: - Please ensure there is not already an duplicate clearance open for this procedure. - Under Visit Info/Reason for Call, type in Other and utilize the format Clearance MM/DD/YY or Clearance TBD. Do not use dashes or single digits. - If request is for dental extraction, please clarify the # of teeth to be extracted.  Request for surgical clearance:  1. What type of surgery is being performed? Nasal Valve Collapse  2. When is this surgery scheduled? 01/06/21  3. What type of clearance is required (medical clearance vs. Pharmacy clearance to hold med vs. Both)? Pharmacy  4. Are there any medications that need to be held prior to surgery and how long? Hold Plavix - for time deemed appropriate by cardiology  5. Practice name and name of physician performing surgery? Dr. Redmond Baseman, [facility]  6. What is the office phone number? [phone]   7.   What is the office fax number? [phone]  8.   Anesthesia type (None, local, MAC, general) ? Not indicated   Joshua Rojas 01/01/2021, 8:14 AM  _________________________________________________________________   (provider comments below)

## 2021-01-01 NOTE — Telephone Encounter (Signed)
   Primary Cardiologist: Armanda Magic, MD  Chart reviewed as part of pre-operative protocol coverage. Given past medical history and time since last visit, based on ACC/AHA guidelines, Joshua Rojas would be at acceptable risk for the planned procedure without further cardiovascular testing.   Per Dr. Mayford Knife, patient may hold Plavix prior to surgery then resume once stable from a bleeding standpoint thereafter.   The patient was advised that if he develops new symptoms prior to surgery to contact our office to arrange for a follow-up visit, and he verbalized understanding.  I will route this recommendation to the requesting party via Epic fax function and remove from pre-op pool.  Please call with questions.  Georgie Chard, NP 01/01/2021, 11:27 AM

## 2021-01-02 NOTE — Telephone Encounter (Signed)
Follow Up:       Joshua Rojas is calling to check on the status of pt's clearance.

## 2021-01-02 NOTE — Telephone Encounter (Signed)
Left message for requesting office that our office has re-faxed clearance to fax # [phone]. Please call our office if still have not received clearance.

## 2021-01-09 ENCOUNTER — Other Ambulatory Visit: Payer: Self-pay | Admitting: Cardiology

## 2021-01-22 ENCOUNTER — Other Ambulatory Visit: Payer: Self-pay

## 2021-01-22 ENCOUNTER — Encounter: Payer: Self-pay | Admitting: Cardiology

## 2021-01-22 ENCOUNTER — Ambulatory Visit (INDEPENDENT_AMBULATORY_CARE_PROVIDER_SITE_OTHER): Payer: Medicare Other | Admitting: Cardiology

## 2021-01-22 VITALS — BP 110/70 | HR 62 | Ht 71.0 in | Wt 272.0 lb

## 2021-01-22 DIAGNOSIS — I2510 Atherosclerotic heart disease of native coronary artery without angina pectoris: Secondary | ICD-10-CM | POA: Diagnosis not present

## 2021-01-22 DIAGNOSIS — E785 Hyperlipidemia, unspecified: Secondary | ICD-10-CM

## 2021-01-22 DIAGNOSIS — I10 Essential (primary) hypertension: Secondary | ICD-10-CM | POA: Diagnosis not present

## 2021-01-22 NOTE — Progress Notes (Signed)
Cardiology Office Note:    Date:  01/22/2021   ID:  Joshua Rojas, DOB 1966-08-30, MRN [id number]  PCP:  Tracey Harries, MD  Cardiologist:  Armanda Magic, MD    Referring MD: Tracey Harries, MD   Chief Complaint  Patient presents with  . Coronary Artery Disease  . Hypertension  . Hyperlipidemia    History of Present Illness:    Joshua Rojas is a 55 y.o. male with a hx of CAD with remote MI andPCI in 2011 with readmission for 2019 with chest pain and NSTEMI Troponin level peaked at 4.58. Cath showed 95% proximal LAD, 80% D1 and occluded OM 2 and underwent PCI/drug-eluting stent x1 to the mid LAD with jailing of a small caliber 80% diagonal. Left ventricular EF was normal by cath and and echo at 60 to 65%. He was started on DAPT with aspirin and Brilinta and also on Imdur 30 mg daily. He was also placed on high-dose statin therapy atorvastatin.  Unfortunately he developed shortness of breath in which transition from Brilinta to Plavix.  He also felt that he was not tolerating Lipitor as well as he tolerated simvastatin he was changed back to simvastatin 40 mg daily.  Additional history includes hypertension, anxiety and depression.   he is here today for followup and is doing well.  He had some DOE about 2 months ago off and on of unknown cause but then mysteriously went away.  He had nasal surgery 2 weeks ago and did well.  He denies any chest pain or pressure, PND, orthopnea, LE edema, dizziness, palpitations or syncope. He is compliant with his meds and is tolerating meds with no SE.    Past Medical History:  Diagnosis Date  . Coronary artery disease    s/p remote 1997 and subsequent PCI, remote MI 2011 and s/p NSTEMI 2019 with cath showing 95% proximal LAD, 80% D1 and occluded OM 2 and underwent PCI/drug-eluting stent x1 to the mid LAD with jailing of a small caliber 80% diagonal  . GERD (gastroesophageal reflux disease)   . H/O heart artery stent   . Hyperlipidemia LDL goal <70   .  Hypertension   . Interstitial cystitis   . MI, old     Past Surgical History:  Procedure Laterality Date  . CARDIAC CATHETERIZATION    . CARDIAC CATHETERIZATION  02/28/2018  . CHOLECYSTECTOMY    . CORONARY ANGIOPLASTY    . CORONARY STENT INTERVENTION  02/28/2018  . CORONARY STENT INTERVENTION N/A 02/28/2018   Procedure: CORONARY STENT INTERVENTION;  Surgeon: Lennette Bihari, MD;  Location[facility] INVASIVE CV LAB;  Service: Cardiovascular;  Laterality: N/A;  . LEFT HEART CATH AND CORONARY ANGIOGRAPHY N/A 02/28/2018   Procedure: LEFT HEART CATH AND CORONARY ANGIOGRAPHY;  Surgeon: Lennette Bihari, MD;  Location: [facility] INVASIVE CV LAB;  Service: Cardiovascular;  Laterality: N/A;    Current Medications: Current Meds  Medication Sig  . ALPRAZolam (XANAX) 1 MG tablet Take 0.5-2 mg by mouth See admin instructions. Take 0.5 mg by mouth two times a day and 2 mg at bedtime  . amitriptyline (ELAVIL) 25 MG tablet Take 25 mg by mouth daily.  Marland Kitchen aspirin EC 81 MG tablet Take 81 mg by mouth daily.   Marland Kitchen azelastine (ASTELIN) 0.1 % nasal spray Place 2 sprays into both nostrils daily as needed for rhinitis or allergies.   . citalopram (CELEXA) 40 MG tablet Take 40 mg by mouth at bedtime.   . clopidogrel (PLAVIX) 75 MG tablet Take  1 tablet (75 mg total) by mouth daily.  . fluticasone (FLONASE) 50 MCG/ACT nasal spray Place 2 sprays into both nostrils daily as needed for allergies or rhinitis.   Marland Kitchen gabapentin (NEURONTIN) 600 MG tablet Take 600 mg by mouth 3 (three) times daily.  Marland Kitchen HYDROcodone-acetaminophen (NORCO) 10-325 MG per tablet Take 0.5-1 tablets by mouth See admin instructions. Take 1 tablet by mouth three times a day and may take an additional 0.5-1 tablet daily as needed  . hydrOXYzine (ATARAX/VISTARIL) 25 MG tablet Take 25 mg by mouth at bedtime.   . metoprolol succinate (TOPROL-XL) 50 MG 24 hr tablet Take 1 tablet (50 mg total) by mouth daily. Take with or immediately following a meal.  . pantoprazole  (PROTONIX) 40 MG tablet TAKE 1 TABLET BY MOUTH EVERY DAY  . rosuvastatin (CRESTOR) 40 MG tablet Take 1 tablet (40 mg total) by mouth daily. Please keep upcoming appt in March 2022 with Dr. Mayford Knife before anymore refills. Thank you  . sodium chloride (OCEAN) 0.65 % nasal spray Place 1 spray into the nose 3 (three) times daily as needed for congestion.   . Tamsulosin HCl (FLOMAX) 0.4 MG CAPS Take 0.4 mg by mouth at bedtime.   Marland Kitchen tiZANidine (ZANAFLEX) 4 MG tablet Take 1 tablet by mouth 3 (three) times daily.     Allergies:   Benadryl [diphenhydramine hcl], Sudafed [pseudoephedrine], and Triprolidine-pse   Social History   Socioeconomic History  . Marital status: Single    Spouse name: Not on file  . Number of children: Not on file  . Years of education: Not on file  . Highest education level: Not on file  Occupational History  . Not on file  Tobacco Use  . Smoking status: Current Every Day Smoker    Packs/day: 1.00    Years: 35.00    Pack years: 35.00    Types: Cigarettes  . Smokeless tobacco: Never Used  Vaping Use  . Vaping Use: Never used  Substance and Sexual Activity  . Alcohol use: No  . Drug use: No  . Sexual activity: Not Currently  Other Topics Concern  . Not on file  Social History Narrative  . Not on file   Social Determinants of Health   Financial Resource Strain: Not on file  Food Insecurity: Not on file  Transportation Needs: Not on file  Physical Activity: Not on file  Stress: Not on file  Social Connections: Not on file     Family History: The patient's family history includes Diabetes in his father.  ROS:   Please see the history of present illness.    ROS  All other systems reviewed and negative.   EKGs/Labs/Other Studies Reviewed:    The following studies were reviewed today: EKG  EKG:  EKG is  ordered today.  The ekg ordered today demonstrates NSR with iRBBB  Recent Labs: No results found for requested labs within last 8760 hours.    Recent Lipid Panel    Component Value Date/Time   CHOL 121 01/02/2019 1614   TRIG 211 (H) 01/02/2019 1614   HDL 32 (L) 01/02/2019 1614   CHOLHDL 3.8 01/02/2019 1614   CHOLHDL 4.4 02/26/2018 0558   VLDL 33 02/26/2018 0558   LDLCALC 47 01/02/2019 1614    Physical Exam:    VS:  BP 110/70 (BP Location: Left Arm, Patient Position: Sitting, Cuff Size: Normal)   Pulse 62   Ht 5\' 11"  (1.803 m)   Wt 272 lb (123.4 kg)  SpO2 97%   BMI 37.94 kg/m     Wt Readings from Last 3 Encounters:  01/22/21 272 lb (123.4 kg)  01/26/20 288 lb (130.6 kg)  08/21/19 283 lb (128.4 kg)    GEN: Well nourished, well developed in no acute distress HEENT: Normal NECK: No JVD; No carotid bruits LYMPHATICS: No lymphadenopathy CARDIAC:RRR, no murmurs, rubs, gallops RESPIRATORY:  Clear to auscultation without rales, wheezing or rhonchi  ABDOMEN: Soft, non-tender, non-distended MUSCULOSKELETAL:  No edema; No deformity  SKIN: Warm and dry NEUROLOGIC:  Alert and oriented x 3 PSYCHIATRIC:  Normal affect    ASSESSMENT:    1. Coronary artery disease involving native coronary artery of native heart without angina pectoris   2. Essential hypertension   3. Hyperlipidemia LDL goal <70    PLAN:    In order of problems listed above:  1.  ASCAD - s/p remote MI andPCI in 2011 with readmission for 2019 with chest pain and NSTEMI with troponin level peaked at 4.58. Cath showed 95% proximal LAD, 80% D1 and occluded OM 2 and underwent PCI/drug-eluting stent x1 to the mid LAD with jailing of a small caliber 80% diagonal. Steffanie Dunn 2020 for DOE showed no ischemia -continue ASA 81mg  daily, Plavix 75mg  daily, BB and statin  2.  HTN -BP is adequately controlled on exam today -continue Toprol XL 50mg  daily  3.  HLD -LDL goal is < 70 -continue Crestor 40mg  and Zetia 10mg  daily -check FLP and ALT  Followup with me in 1 year   Medication Adjustments/Labs and Tests Ordered: Current medicines are  reviewed at length with the patient today.  Concerns regarding medicines are outlined above.  No orders of the defined types were placed in this encounter.  No orders of the defined types were placed in this encounter.   Signed, , MD  01/22/2021 4:02 PM    [facility]

## 2021-01-22 NOTE — Patient Instructions (Signed)
Medication Instructions:  Your physician recommends that you continue on your current medications as directed. Please refer to the Current Medication list given to you today.  *If you need a refill on your cardiac medications before your next appointment, please call your pharmacy*  Lab Work: TODAY: CMET and FLP If you have labs (blood work) drawn today and your tests are completely normal, you will receive your results only by: MyChart Message (if you have MyChart) OR A paper copy in the mail If you have any lab test that is abnormal or we need to change your treatment, we will call you to review the results.  Follow-Up: At [facility], you and your health needs are our priority.  As part of our continuing mission to provide you with exceptional heart care, we have created designated Provider Care Teams.  These Care Teams include your primary Cardiologist (physician) and Advanced Practice Providers (APPs -  Physician Assistants and Nurse Practitioners) who all work together to provide you with the care you need, when you need it.  Your next appointment:   1 year(s)  The format for your next appointment:   In Person  Provider:   You may see Traci Turner, MD or one of the following Advanced Practice Providers on your designated Care Team:   Dayna Dunn, PA-C Michele Lenze, PA-C  

## 2021-01-22 NOTE — Addendum Note (Signed)
Addended by: Theresia Majors on: 01/22/2021 04:07 PM   Modules accepted: Orders

## 2021-01-23 LAB — LIPID PANEL
Chol/HDL Ratio: 3.5 ratio (ref 0.0–5.0)
Cholesterol, Total: 90 mg/dL — ABNORMAL LOW (ref 100–199)
HDL: 26 mg/dL — ABNORMAL LOW (ref >39)
LDL Chol Calc (NIH): 36 mg/dL (ref 0–99)
Triglycerides: 170 mg/dL — ABNORMAL HIGH (ref 0–149)
VLDL Cholesterol Cal: 28 mg/dL (ref 5–40)

## 2021-01-23 LAB — COMPREHENSIVE METABOLIC PANEL
ALT: 25 IU/L (ref 0–44)
AST: 25 IU/L (ref 0–40)
Albumin/Globulin Ratio: 1.8 (ref 1.2–2.2)
Albumin: 4.2 g/dL (ref 3.8–4.9)
Alkaline Phosphatase: 78 IU/L (ref 44–121)
BUN/Creatinine Ratio: 9 (ref 9–20)
BUN: 10 mg/dL (ref 6–24)
Bilirubin Total: 0.5 mg/dL (ref 0.0–1.2)
CO2: 23 mmol/L (ref 20–29)
Calcium: 9 mg/dL (ref 8.7–10.2)
Chloride: 102 mmol/L (ref 96–106)
Creatinine, Ser: 1.11 mg/dL (ref 0.76–1.27)
Globulin, Total: 2.4 g/dL (ref 1.5–4.5)
Glucose: 114 mg/dL — ABNORMAL HIGH (ref 65–99)
Potassium: 4.4 mmol/L (ref 3.5–5.2)
Sodium: 141 mmol/L (ref 134–144)
Total Protein: 6.6 g/dL (ref 6.0–8.5)
eGFR: 80 mL/min/{1.73_m2} (ref >59)

## 2021-01-28 ENCOUNTER — Telehealth: Payer: Self-pay

## 2021-01-28 DIAGNOSIS — E785 Hyperlipidemia, unspecified: Secondary | ICD-10-CM

## 2021-01-28 NOTE — Telephone Encounter (Signed)
-----   Message from Quintella Reichert, MD sent at 01/23/2021  9:24 AM EDT ----- Please verify that these labs were fasting and if yes then refer to lipid clinic

## 2021-01-28 NOTE — Telephone Encounter (Signed)
Patient notified of results via MyChart. He verified that he was fasting for the lab work. Referral has been placed for lipid clinic.

## 2021-01-30 ENCOUNTER — Other Ambulatory Visit: Payer: Self-pay | Admitting: Cardiology

## 2021-02-10 ENCOUNTER — Other Ambulatory Visit: Payer: Self-pay | Admitting: Cardiology

## 2021-02-19 ENCOUNTER — Ambulatory Visit (INDEPENDENT_AMBULATORY_CARE_PROVIDER_SITE_OTHER): Payer: Medicare Other | Admitting: Pharmacist

## 2021-02-19 ENCOUNTER — Other Ambulatory Visit: Payer: Self-pay

## 2021-02-19 DIAGNOSIS — E7800 Pure hypercholesterolemia, unspecified: Secondary | ICD-10-CM | POA: Diagnosis not present

## 2021-02-19 DIAGNOSIS — I214 Non-ST elevation (NSTEMI) myocardial infarction: Secondary | ICD-10-CM | POA: Diagnosis not present

## 2021-02-19 DIAGNOSIS — I2510 Atherosclerotic heart disease of native coronary artery without angina pectoris: Secondary | ICD-10-CM

## 2021-02-19 MED ORDER — NITROGLYCERIN 0.4 MG SL SUBL: 0.4000 mg | SUBLINGUAL_TABLET | SUBLINGUAL | 3 refills | Status: DC | PRN

## 2021-02-19 NOTE — Patient Instructions (Addendum)
It was good meeting you today  We would like your triglycerides to be less than 150  Try to avoid simple sugars and trans fats  Try switch your carbohydrate sources to whole grains and use unsaturated fats such as nuts, avocados, and olive oil  Avoid foods with trans fat or have ingredients listed as "partially hydrogenated oil"  Try to increase physical activity as tolerated  Try skin protectants such as Vitamin A & D ointment for your legs  Continue your rosuvastatin 40mg  once daily  We will check your lipid panel again in 4 months  Please call with any questions   , PharmD, BCACP, CDCES, CPP [facility] [address] [place], [place] Phone: [phone]; Fax: [phone] 02/19/2021 4:25 PM

## 2021-02-19 NOTE — Progress Notes (Signed)
Patient ID: Joshua Rojas                 DOB: 08/11/1966                    MRN: [id number]     HPI: Joshua Rojas is a 55 y.o. male patient referred to lipid clinic by Dr Mayford Knife. PMH is significant for CAD with remote MI andPCI in 2011with readmission for 2019 with chest pain and NSTEMI.  Patient has a history of interstitial cystitis with frequent flares sometimes caused by medications or foods.  Patient has been a Vegan for the last 18 years. Not currently working, lives with his mother.  Reports he is mostly a night owl and he is very inactive.  When his heart rate elevates he reports he has an IC flare.  Eats a large amount of carbohydrates.  Diet is instant oatmeal with blueberries and half a banana and rice with brown rice and bread.  Drinks sweetened almond milk and eats almond milk ice cream.  Is used to looking at nutrition labels for foods containing animal products but diet is not varied due to fear of foods causing flares.  Reports also certain vitamins cause flares.   Currently on atorvastatin 40mg  and tolerating without adverse effects.  Was previously placed on fenofibrate but it caused severe myalgias.  Will not take fish oil due to Vegan lifestyle.  Smokes 1 pack per day.  Does not drink alcohol.  Is concerned about bruising/wounds on legs.  Current Medications: rosuvastatin 40mg  Intolerances: atorvastatin  Risk Factors: CAD, hx of NSTEMI, smoking, low HDL LDL goal: <70  Labs:  TC 90, Trigs 170, HDL 26, LDL 36 (on rosuvastatin 40mg   Past Medical History:  Diagnosis Date  . Coronary artery disease    s/p remote 1997 and subsequent PCI, remote MI 2011 and s/p NSTEMI 2019 with cath showing 95% proximal LAD, 80% D1 and occluded OM 2 and underwent PCI/drug-eluting stent x1 to the mid LAD with jailing of a small caliber 80% diagonal  . GERD (gastroesophageal reflux disease)   . H/O heart artery stent   . Hyperlipidemia LDL goal <70   . Hypertension   . Interstitial cystitis    . MI, old     Current Outpatient Medications on File Prior to Visit  Medication Sig Dispense Refill  . ALPRAZolam (XANAX) 1 MG tablet Take 0.5-2 mg by mouth See admin instructions. Take 0.5 mg by mouth two times a day and 2 mg at bedtime    . amitriptyline (ELAVIL) 25 MG tablet Take 25 mg by mouth daily.    1998 aspirin EC 81 MG tablet Take 81 mg by mouth daily.     2012 azelastine (ASTELIN) 0.1 % nasal spray Place 2 sprays into both nostrils daily as needed for rhinitis or allergies.   2  . citalopram (CELEXA) 40 MG tablet Take 40 mg by mouth at bedtime.     . clopidogrel (PLAVIX) 75 MG tablet TAKE 1 TABLET BY MOUTH EVERY DAY 90 tablet 3  . fluticasone (FLONASE) 50 MCG/ACT nasal spray Place 2 sprays into both nostrils daily as needed for allergies or rhinitis.   2  . gabapentin (NEURONTIN) 600 MG tablet Take 600 mg by mouth 3 (three) times daily.    2020 HYDROcodone-acetaminophen (NORCO) 10-325 MG per tablet Take 0.5-1 tablets by mouth See admin instructions. Take 1 tablet by mouth three times a day and may take an additional 0.5-1 tablet daily as  needed    . hydrOXYzine (ATARAX/VISTARIL) 25 MG tablet Take 25 mg by mouth at bedtime.     . metoprolol succinate (TOPROL-XL) 50 MG 24 hr tablet Take 1 tablet (50 mg total) by mouth daily. Take with or immediately following a meal. 90 tablet 3  . nitroGLYCERIN (NITROSTAT) 0.4 MG SL tablet Place 1 tablet (0.4 mg total) under the tongue every 5 (five) minutes as needed for chest pain. 25 tablet 3  . pantoprazole (PROTONIX) 40 MG tablet TAKE 1 TABLET BY MOUTH EVERY DAY 30 tablet 0  . rosuvastatin (CRESTOR) 40 MG tablet Take 1 tablet (40 mg total) by mouth daily. 90 tablet 3  . sodium chloride (OCEAN) 0.65 % nasal spray Place 1 spray into the nose 3 (three) times daily as needed for congestion.     . Tamsulosin HCl (FLOMAX) 0.4 MG CAPS Take 0.4 mg by mouth at bedtime.     Marland Kitchen tiZANidine (ZANAFLEX) 4 MG tablet Take 1 tablet by mouth 3 (three) times daily.  11    No current facility-administered medications on file prior to visit.    Allergies  Allergen Reactions  . Benadryl [Diphenhydramine Hcl] Other (See Comments)    Makes interstitial cystitis worse  . Sudafed [Pseudoephedrine] Other (See Comments)    Makes interstitial cystitis worse  . Triprolidine-Pse Other (See Comments)    Makes interstitial cystitis worse    Assessment/Plan:  1. Hyperlipidemia - Patient triglycerides 170 which is above goal of <150.  LDL currently at goal, however HDL very low.  Likely due to patient's diet high in carbohydrates, smoking history, and very low physical activity.  Challenging due to patient's history of certain foods causing IC flares.    Discussed diet in detail with patient.  Due to patient's Vegan diet, recommended he substitute his more simple carbohydrates for complex carbohydrates such as whole grains.  Recommended he avoid foods with added sugars.  Discussed importance of eliminating processed foods due to possibility of hidden trans fats.  Educated on how to identify trans fats and partially hydrogenated oils.    Patient also very physcially inactive due to fear of IC flare.  Recommended patient begin to increase physcial activity as tolerated as this will likely help his low HDL and elevating blood sugar.  Patient will not take Vascepa/fish oil due to diet restrictions.  Intolerant to fenofibrate and concern regarding possible side effects/interactions with gemfibrozil.  Niacin may negatively impact blood sugar levels and patient is worried regarding certain vitamins causing his flares.  Is willing to try lifestyle modifications.  Will give patient 4 months before rechecking lipid panel.  Continue rosuvastatin 40mg  daily Recheck lipid panel in 4 months Refill nitro (patient reports is expired) Recheck as needed  , PharmD, BCACP, CDCES, CPP [facility] [address] [place], [place] Phone: [phone]; Fax: [phone] 02/19/2021 6:03 PM

## 2021-03-21 ENCOUNTER — Other Ambulatory Visit: Payer: Self-pay | Admitting: Cardiology

## 2021-03-21 NOTE — Telephone Encounter (Signed)
Pt's pharmacy is requesting a refill on pantoprazole. Would Dr. Turner like to refill this medication? Please address 

## 2021-04-07 DIAGNOSIS — N9489 Other specified conditions associated with female genital organs and menstrual cycle: Secondary | ICD-10-CM | POA: Insufficient documentation

## 2021-04-16 ENCOUNTER — Other Ambulatory Visit: Payer: Self-pay | Admitting: Cardiology

## 2021-04-17 NOTE — Telephone Encounter (Signed)
Pt's pharmacy is requesting a refill on pantoprazole. Would Dr. Turner like to refill this medication? Please address 

## 2021-05-08 ENCOUNTER — Other Ambulatory Visit: Payer: Self-pay | Admitting: Cardiology

## 2021-06-20 ENCOUNTER — Other Ambulatory Visit: Payer: Medicare Other

## 2021-08-18 ENCOUNTER — Other Ambulatory Visit: Payer: Self-pay | Admitting: Cardiology

## 2021-11-10 ENCOUNTER — Other Ambulatory Visit: Payer: Self-pay | Admitting: Cardiology

## 2022-02-06 ENCOUNTER — Emergency Department (HOSPITAL_BASED_OUTPATIENT_CLINIC_OR_DEPARTMENT_OTHER): Payer: Medicare Other

## 2022-02-06 ENCOUNTER — Other Ambulatory Visit: Payer: Self-pay

## 2022-02-06 ENCOUNTER — Emergency Department (HOSPITAL_BASED_OUTPATIENT_CLINIC_OR_DEPARTMENT_OTHER): Payer: Medicare Other | Admitting: Radiology

## 2022-02-06 ENCOUNTER — Encounter (HOSPITAL_BASED_OUTPATIENT_CLINIC_OR_DEPARTMENT_OTHER): Payer: Self-pay

## 2022-02-06 ENCOUNTER — Emergency Department (HOSPITAL_BASED_OUTPATIENT_CLINIC_OR_DEPARTMENT_OTHER)
Admission: EM | Admit: 2022-02-06 | Discharge: 2022-02-07 | Disposition: A | Discharge status: Home / Self Care | Payer: Medicare Other | Attending: Emergency Medicine | Admitting: Emergency Medicine

## 2022-02-06 DIAGNOSIS — Z7982 Long term (current) use of aspirin: Secondary | ICD-10-CM | POA: Diagnosis not present

## 2022-02-06 DIAGNOSIS — M79604 Pain in right leg: Secondary | ICD-10-CM | POA: Insufficient documentation

## 2022-02-06 DIAGNOSIS — S0990XA Unspecified injury of head, initial encounter: Secondary | ICD-10-CM | POA: Diagnosis not present

## 2022-02-06 DIAGNOSIS — M79605 Pain in left leg: Secondary | ICD-10-CM | POA: Diagnosis not present

## 2022-02-06 DIAGNOSIS — W19XXXA Unspecified fall, initial encounter: Secondary | ICD-10-CM

## 2022-02-06 DIAGNOSIS — W1830XA Fall on same level, unspecified, initial encounter: Secondary | ICD-10-CM | POA: Diagnosis not present

## 2022-02-06 LAB — BASIC METABOLIC PANEL
Anion gap: 8 (ref 5–15)
BUN: 13 mg/dL (ref 6–20)
CO2: 28 mmol/L (ref 22–32)
Calcium: 9.2 mg/dL (ref 8.9–10.3)
Chloride: 102 mmol/L (ref 98–111)
Creatinine, Ser: 1.21 mg/dL (ref 0.61–1.24)
GFR, Estimated: >60 mL/min (ref >60)
Glucose, Bld: 117 mg/dL — ABNORMAL HIGH (ref 70–99)
Potassium: 4.4 mmol/L (ref 3.5–5.1)
Sodium: 138 mmol/L (ref 135–145)

## 2022-02-06 LAB — CBC WITH DIFFERENTIAL/PLATELET
Abs Immature Granulocytes: 0.02 10*3/uL (ref 0.00–0.07)
Basophils Absolute: 0.1 10*3/uL (ref 0.0–0.1)
Basophils Relative: 1 %
Eosinophils Absolute: 0.1 10*3/uL (ref 0.0–0.5)
Eosinophils Relative: 1 %
HCT: 44.3 % (ref 39.0–52.0)
Hemoglobin: 15.3 g/dL (ref 13.0–17.0)
Immature Granulocytes: 0 %
Lymphocytes Relative: 25 %
Lymphs Abs: 2.1 10*3/uL (ref 0.7–4.0)
MCH: 34 pg (ref 26.0–34.0)
MCHC: 34.5 g/dL (ref 30.0–36.0)
MCV: 98.4 fL (ref 80.0–100.0)
Monocytes Absolute: 0.5 10*3/uL (ref 0.1–1.0)
Monocytes Relative: 6 %
Neutro Abs: 5.7 10*3/uL (ref 1.7–7.7)
Neutrophils Relative %: 67 %
Platelets: 135 10*3/uL — ABNORMAL LOW (ref 150–400)
RBC: 4.5 MIL/uL (ref 4.22–5.81)
RDW: 13.6 % (ref 11.5–15.5)
WBC: 8.4 10*3/uL (ref 4.0–10.5)
nRBC: 0 % (ref 0.0–0.2)

## 2022-02-06 MED ORDER — LIDOCAINE 5 % EX PTCH
3.0000 | MEDICATED_PATCH | CUTANEOUS | Status: DC
  Administered 2022-02-06: 3 via TRANSDERMAL
  Filled 2022-02-06: qty 3

## 2022-02-06 MED ORDER — METHOCARBAMOL 500 MG PO TABS
1000.0000 mg | ORAL_TABLET | Freq: Once | ORAL | Status: AC
  Administered 2022-02-06: 1000 mg via ORAL
  Filled 2022-02-06: qty 2

## 2022-02-06 MED ORDER — ALPRAZOLAM 0.5 MG PO TABS
1.0000 mg | ORAL_TABLET | Freq: Once | ORAL | Status: AC
  Administered 2022-02-06: 1 mg via ORAL
  Filled 2022-02-06: qty 2

## 2022-02-06 MED ORDER — KETOROLAC TROMETHAMINE 60 MG/2ML IM SOLN
60.0000 mg | Freq: Once | INTRAMUSCULAR | Status: AC
  Administered 2022-02-06: 60 mg via INTRAMUSCULAR
  Filled 2022-02-06: qty 2

## 2022-02-06 NOTE — ED Notes (Signed)
Patient transported to CT 

## 2022-02-06 NOTE — ED Provider Notes (Addendum)
?[facility]PT ?Provider Note ? ? ?CSN: [id number] ?Arrival date & time: 02/06/22  2025 ? ?  ? ?History ?{ ?Chief Complaint  ?Patient presents with  ? Fall  ? Leg Pain  ? ? ?Joshua Rojas is a 56 y.o. male. ? ?The history is provided by the patient.  ?Fall ?This is a new problem. The current episode started yesterday. The problem occurs rarely. The problem has been resolved. Pertinent negatives include no chest pain, no abdominal pain, no headaches and no shortness of breath. Nothing aggravates the symptoms. Nothing relieves the symptoms. Treatments tried: zanaflex, xanax and home narcotics. The treatment provided no relief.  ?Leg Pain ?Associated symptoms: no fever   ?Patient states he fell onto his bottom yesterday and is having pain.  No weakness or numbness.  No urinary symptoms.   ?  ? ?Home Medications ?Prior to Admission medications   ?Medication Sig Start Date End Date Taking? Authorizing Provider  ?ALPRAZolam (XANAX) 1 MG tablet Take 0.5-2 mg by mouth See admin instructions. Take 0.5 mg by mouth two times a day and 2 mg at bedtime    [provider]  ?amitriptyline (ELAVIL) 25 MG tablet Take 25 mg by mouth daily. 07/11/19   [provider]  ?aspirin EC 81 MG tablet Take 81 mg by mouth daily.     [provider]  ?azelastine (ASTELIN) 0.1 % nasal spray Place 2 sprays into both nostrils daily as needed for rhinitis or allergies.  ?Patient not taking: Reported on 02/19/2021 01/26/18   [provider]  ?citalopram (CELEXA) 40 MG tablet Take 40 mg by mouth at bedtime.     [provider]  ?clopidogrel (PLAVIX) 75 MG tablet TAKE 1 TABLET BY MOUTH EVERY DAY 11/11/21   Quintella Reichert, MD  ?fluticasone (FLONASE) 50 MCG/ACT nasal spray Place 2 sprays into both nostrils daily as needed for allergies or rhinitis.  ?Patient not taking: Reported on 02/19/2021 01/12/18   [provider]  ?gabapentin (NEURONTIN) 600 MG tablet Take 600 mg by mouth 3  (three) times daily.    [provider]  ?HYDROcodone-acetaminophen (NORCO) 10-325 MG per tablet Take 0.5-1 tablets by mouth See admin instructions. Take 1 tablet by mouth three times a day and may take an additional 0.5-1 tablet daily as needed    [provider]  ?hydrOXYzine (ATARAX/VISTARIL) 25 MG tablet Take 25 mg by mouth at bedtime.     [provider]  ?metoprolol succinate (TOPROL-XL) 50 MG 24 hr tablet Take 1 tablet (50 mg total) by mouth daily. Take with or immediately following a meal. 01/26/20   Turner, [name], MD  ?nitroGLYCERIN (NITROSTAT) 0.4 MG SL tablet Place 1 tablet (0.4 mg total) under the tongue every 5 (five) minutes as needed for chest pain. 02/19/21 05/20/21  Quintella Reichert, MD  ?pantoprazole (PROTONIX) 40 MG tablet TAKE 1 TABLET BY MOUTH EVERY DAY 08/18/21   Quintella Reichert, MD  ?rosuvastatin (CRESTOR) 40 MG tablet Take 1 tablet (40 mg total) by mouth daily. 02/10/21   Quintella Reichert, MD  ?sodium chloride (OCEAN) 0.65 % nasal spray Place 1 spray into the nose 3 (three) times daily as needed for congestion.     [provider]  ?Tamsulosin HCl (FLOMAX) 0.4 MG CAPS Take 0.4 mg by mouth at bedtime.     [provider]  ?tiZANidine (ZANAFLEX) 4 MG tablet Take 1 tablet by mouth 3 (three) times daily. 05/30/18   [provider]  ?   ? ?  Allergies    ?Benadryl [diphenhydramine hcl], Sudafed [pseudoephedrine], and Triprolidine-pse   ? ?Review of Systems   ?Review of Systems  ?Constitutional:  Negative for fever.  ?HENT:  Negative for facial swelling.   ?Eyes:  Negative for redness.  ?Respiratory:  Negative for shortness of breath.   ?Cardiovascular:  Negative for chest pain.  ?Gastrointestinal:  Negative for abdominal pain.  ?Genitourinary:  Negative for difficulty urinating.  ?Neurological:  Negative for weakness, numbness and headaches.  ?Psychiatric/Behavioral:  Negative for agitation.   ?All other systems reviewed and are negative. ? ?Physical  Exam ?Updated Vital Signs ?BP 130/64   Pulse 77   Temp 98.3 ?F (36.8 ?C)   Resp 18   Ht  (1.803 m)   Wt 127 kg   SpO2 100%   BMI 39.05 kg/m?  ?Physical Exam ?Vitals and nursing note reviewed. Exam conducted with a chaperone present (nurse present for the entirety of history and physical).  ?Constitutional:   ?   Appearance: Normal appearance.  ?HENT:  ?   Head: Normocephalic and atraumatic.  ?   Right Ear: Tympanic membrane normal.  ?   Left Ear: Tympanic membrane normal.  ?   Nose: Nose normal.  ?   Mouth/Throat:  ?   Mouth: Mucous membranes are moist.  ?Eyes:  ?   Extraocular Movements: Extraocular movements intact.  ?   Conjunctiva/sclera: Conjunctivae normal.  ?   Pupils: Pupils are equal, round, and reactive to light.  ?Cardiovascular:  ?   Rate and Rhythm: Normal rate and regular rhythm.  ?   Pulses: Normal pulses.  ?   Heart sounds: Normal heart sounds.  ?Pulmonary:  ?   Effort: Pulmonary effort is normal. No respiratory distress.  ?   Breath sounds: Normal breath sounds. No wheezing or rales.  ?Abdominal:  ?   General: Bowel sounds are normal.  ?   Palpations: Abdomen is soft.  ?   Tenderness: There is no abdominal tenderness. There is no guarding or rebound.  ?Musculoskeletal:     ?   General: No tenderness. Normal range of motion.  ?   Cervical back: Normal range of motion and neck supple. No tenderness.  ?   Right hip: Normal.  ?   Left hip: Normal.  ?   Right upper leg: Normal.  ?   Left upper leg: Normal.  ?   Right knee: No crepitus. No LCL laxity, MCL laxity, ACL laxity or PCL laxity. Normal alignment, normal meniscus and normal patellar mobility.  ?   Instability Tests: Anterior drawer test negative. Posterior drawer test negative.  ?   Left knee: No crepitus. No LCL laxity, MCL laxity, ACL laxity or PCL laxity.Normal alignment, normal meniscus and normal patellar mobility.  ?   Instability Tests: Anterior drawer test negative. Posterior drawer test negative.  ?   Right lower leg:  Normal. No deformity, lacerations or tenderness.  ?   Left lower leg: Normal. No deformity, lacerations or tenderness.  ?   Right ankle: Normal. Anterior drawer test negative. Normal pulse.  ?   Right Achilles Tendon: Normal.  ?   Left ankle: Normal. Anterior drawer test negative. Normal pulse.  ?   Left Achilles Tendon: Normal.  ?   Right foot: Normal.  ?   Left foot: Normal.  ?   Comments: No foreshortening, no rotation.  Is moving is legs, intact sensation to B lower extremities.    ?Skin: ?   General: Skin is warm  and dry.  ?   Capillary Refill: Capillary refill takes less than 2 seconds.  ?Neurological:  ?   General: No focal deficit present.  ?   Mental Status: He is alert and oriented to person, place, and time.  ?Psychiatric:     ?   Mood and Affect: Affect is angry.  ? ? ?ED Results / Procedures / Treatments   ?Labs ?(all labs ordered are listed, but only abnormal results are displayed) ?Results for orders placed or performed during the hospital encounter of 02/06/22  ?CBC with Differential  ?Result Value Ref Range  ? WBC 8.4 4.0 - 10.5 K/uL  ? RBC 4.50 4.22 - 5.81 MIL/uL  ? Hemoglobin 15.3 13.0 - 17.0 g/dL  ? HCT 44.3 39.0 - 52.0 %  ? MCV 98.4 80.0 - 100.0 fL  ? MCH 34.0 26.0 - 34.0 pg  ? MCHC 34.5 30.0 - 36.0 g/dL  ? RDW 13.6 11.5 - 15.5 %  ? Platelets 135 (L) 150 - 400 K/uL  ? nRBC 0.0 0.0 - 0.2 %  ? Neutrophils Relative % 67 %  ? Neutro Abs 5.7 1.7 - 7.7 K/uL  ? Lymphocytes Relative 25 %  ? Lymphs Abs 2.1 0.7 - 4.0 K/uL  ? Monocytes Relative 6 %  ? Monocytes Absolute 0.5 0.1 - 1.0 K/uL  ? Eosinophils Relative 1 %  ? Eosinophils Absolute 0.1 0.0 - 0.5 K/uL  ? Basophils Relative 1 %  ? Basophils Absolute 0.1 0.0 - 0.1 K/uL  ? Immature Granulocytes 0 %  ? Abs Immature Granulocytes 0.02 0.00 - 0.07 K/uL  ?Basic metabolic panel  ?Result Value Ref Range  ? Sodium 138 135 - 145 mmol/L  ? Potassium 4.4 3.5 - 5.1 mmol/L  ? Chloride 102 98 - 111 mmol/L  ? CO2 28 22 - 32 mmol/L  ? Glucose, Bld 117 (H) 70 - 99  mg/dL  ? BUN 13 6 - 20 mg/dL  ? Creatinine, Ser 1.21 0.61 - 1.24 mg/dL  ? Calcium 9.2 8.9 - 10.3 mg/dL  ? GFR, Estimated >60 >60 mL/min  ? Anion gap 8 5 - 15  ? ?DG Lumbar Spine Complete ? ?Result Date: 02/06/2022 ?CLINICAL

## 2022-02-06 NOTE — ED Triage Notes (Signed)
Patient complains of shooting bilateral leg pain x last night. Patient states pain came on suddenly after a fall x last night. Patient states he did hit his head but did not loose consciousness. Patient does not know how he fell. Patient states it is too painful to walk +pulses noted.  ?

## 2022-02-07 MED ORDER — LIDOCAINE 5 % EX PTCH
1.0000 | MEDICATED_PATCH | CUTANEOUS | 0 refills | Status: DC
Start: 2022-02-07 — End: 2022-06-22

## 2022-02-07 MED ORDER — MELOXICAM 7.5 MG PO TABS: 7.5000 mg | ORAL_TABLET | Freq: Every day | ORAL | 0 refills | Status: AC

## 2022-02-07 NOTE — ED Notes (Signed)
Pt family sts outside of room at nursing station sts that pt needs his meds and that she is scared of him not having it. RN and MD bedside to speak to pt about ED visit and meds. Pt speaking loudly to staff about needing meds. ?

## 2022-02-07 NOTE — ED Notes (Signed)
Pt family outside of room due to pt req home meds. Pt sts that he takes xanax around 8pm every night and is starting to feel "mentally unwell". ?

## 2022-02-08 ENCOUNTER — Other Ambulatory Visit: Payer: Self-pay | Admitting: Cardiology

## 2022-02-11 DIAGNOSIS — R7303 Prediabetes: Secondary | ICD-10-CM | POA: Insufficient documentation

## 2022-03-06 ENCOUNTER — Other Ambulatory Visit: Payer: Self-pay | Admitting: Cardiology

## 2022-04-27 ENCOUNTER — Other Ambulatory Visit: Payer: Self-pay | Admitting: Cardiology

## 2022-05-07 ENCOUNTER — Ambulatory Visit: Payer: Medicare Other | Admitting: Cardiology

## 2022-05-19 NOTE — Progress Notes (Signed)
Office Visit    Patient Name: Joshua Rojas Date of Encounter: 05/21/2022  PCP:  Tracey Harries, MD   [facility]  Cardiologist:  Armanda Magic, MD  Advanced Practice Provider:  No care team member to display Electrophysiologist:  None   HPI    Joshua Rojas is a 56 y.o. male with a hx of hyperlipidemia, hypertension, anxiety/depression, smoking history, and CAD with remote MI and PCI in 2011 with readmission 2019 with chest pain and NSTEMI underwent PCI with drug-eluting stent x1 to the mid LAD presents today for annual follow-up.  At the time of his last PCI he was started on DAPT with aspirin and Brilinta as well as Imdur 30 mg daily.  He was placed on a high dose statin therapy with atorvastatin.  Unfortunately he had developed shortness of breath in which she was transitioned from Brilinta to Plavix.  He also felt he was not tolerating Lipitor as well as he tolerated simvastatin.  So he was changed back to simvastatin 40 mg daily.  He was seen by Armanda Magic, MD 12/2020.  He was doing well at that time.  He had some DOE about 2 months ago off and on but unknown cause but then mysteriously went away.  He had nasal surgery before his appointment and did well.  He denied any chest pain or pressure, PND, orthopnea, LE edema, dizziness, palpitations or syncope.  He was compliant with all his medications.  Today, he shares that he is still having some shortness of breath.  It has not necessarily gotten any better from when he was last in our office.  He also is having chest pains every few months.  He states that they are sharp or dull in nature and they are usually in the center of his chest.  He has not needed his nitroglycerin.  His primary ordered a CT coronary to further evaluate.  We also discussed ordering an echocardiogram.  His blood pressure is a little bit low today however he tends to stay dehydrated because of his interstitial cystitis which is chronic.  He has  not experienced any dizziness or lightheadedness. He also is concerned that he has had 2 falls recently.  He states that when he stands up his left leg shakes and then becomes weak and then he falls.  He does not have any lightheadedness or dizziness, syncope, presyncope, or palpitations before or during his falls.  From his history, his falls do not seem particularly cardiac.  I have suggested speaking to his primary about this.  Reports no shortness of breath nor dyspnea on exertion. Reports no chest pain, pressure, or tightness. No edema, orthopnea, PND. Reports no palpitations.    Past Medical History    Past Medical History:  Diagnosis Date   Coronary artery disease    s/p remote 1997 and subsequent PCI, remote MI 2011 and s/p NSTEMI 2019 with cath showing 95% proximal LAD, 80% D1 and occluded OM 2 and underwent PCI/drug-eluting stent x1 to the mid LAD with jailing of a small caliber 80% diagonal   GERD (gastroesophageal reflux disease)    H/O heart artery stent    Hyperlipidemia LDL goal <70    Hypertension    Interstitial cystitis    MI, old    Past Surgical History:  Procedure Laterality Date   CARDIAC CATHETERIZATION     CARDIAC CATHETERIZATION  02/28/2018   CHOLECYSTECTOMY     CORONARY ANGIOPLASTY     CORONARY STENT  INTERVENTION  02/28/2018   CORONARY STENT INTERVENTION N/A 02/28/2018   Procedure: CORONARY STENT INTERVENTION;  Surgeon: Lennette Bihari, MD;  Location: [facility] INVASIVE CV LAB;  Service: Cardiovascular;  Laterality: N/A;   LEFT HEART CATH AND CORONARY ANGIOGRAPHY N/A 02/28/2018   Procedure: LEFT HEART CATH AND CORONARY ANGIOGRAPHY;  Surgeon: Lennette Bihari, MD;  Location: [facility] INVASIVE CV LAB;  Service: Cardiovascular;  Laterality: N/A;    Allergies  Allergies  Allergen Reactions   Benadryl [Diphenhydramine Hcl] Other (See Comments)    Makes interstitial cystitis worse   Sudafed [Pseudoephedrine] Other (See Comments)    Makes interstitial cystitis worse    Triprolidine-Pse Other (See Comments)    Makes interstitial cystitis worse     EKGs/Labs/Other Studies Reviewed:   The following studies were reviewed today:  Echocardiogram 01/2018 Study Conclusions   - Left ventricle: The cavity size was mildly dilated. Systolic    function was normal. The estimated ejection fraction was in the    range of 60% to 65%. Wall motion was normal; there were no    regional wall motion abnormalities. Left ventricular diastolic    function parameters were normal.  - Mitral valve: There was trivial regurgitation.  - Pulmonary arteries: Systolic pressure could not be accurately    estimated.   EKG:  EKG is ordered today.  He has a normal sinus rhythm, rate 61 bpm  Recent Labs: 02/06/2022: BUN 13; Creatinine, Ser 1.21; Hemoglobin 15.3; Platelets 135; Potassium 4.4; Sodium 138  Recent Lipid Panel    Component Value Date/Time   CHOL 90 (L) 01/22/2021 1613   TRIG 170 (H) 01/22/2021 1613   HDL 26 (L) 01/22/2021 1613   CHOLHDL 3.5 01/22/2021 1613   CHOLHDL 4.4 02/26/2018 0558   VLDL 33 02/26/2018 0558   LDLCALC 36 01/22/2021 1613     Home Medications   Current Meds  Medication Sig   ALPRAZolam (XANAX) 1 MG tablet Take 0.5-2 mg by mouth See admin instructions. Take 0.5 mg by mouth two times a day and 2 mg at bedtime   amitriptyline (ELAVIL) 25 MG tablet Take 25 mg by mouth daily.   aspirin EC 81 MG tablet Take 81 mg by mouth daily.    azelastine (ASTELIN) 0.1 % nasal spray Place 2 sprays into both nostrils daily as needed for rhinitis or allergies.   citalopram (CELEXA) 40 MG tablet Take 40 mg by mouth at bedtime.    clopidogrel (PLAVIX) 75 MG tablet Take 1 tablet (75 mg total) by mouth daily. Please keep upcoming appt. In July with Cardiologist in order to receive future refills. Thank You.   fluticasone (FLONASE) 50 MCG/ACT nasal spray Place 2 sprays into both nostrils daily as needed for allergies or rhinitis.   gabapentin (NEURONTIN) 600 MG tablet  Take 600 mg by mouth 3 (three) times daily.   HYDROcodone-acetaminophen (NORCO) 10-325 MG per tablet Take 0.5-1 tablets by mouth See admin instructions. Take 1 tablet by mouth three times a day and may take an additional 0.5-1 tablet daily as needed   hydrOXYzine (ATARAX/VISTARIL) 25 MG tablet Take 25 mg by mouth at bedtime.    lidocaine (LIDODERM) 5 % Place 1 patch onto the skin daily. Remove & Discard patch within 12 hours or as directed by MD   meloxicam (MOBIC) 7.5 MG tablet Take 1 tablet (7.5 mg total) by mouth daily.   metoprolol succinate (TOPROL-XL) 50 MG 24 hr tablet Take 1 tablet (50 mg total) by mouth daily. Take with or  immediately following a meal.   oxybutynin (DITROPAN) 5 MG tablet Take 1 tablet by mouth as needed.   pantoprazole (PROTONIX) 40 MG tablet TAKE 1 TABLET BY MOUTH EVERY DAY   rosuvastatin (CRESTOR) 40 MG tablet TAKE 1 TABLET BY MOUTH EVERY DAY   sodium chloride (OCEAN) 0.65 % nasal spray Place 1 spray into the nose 3 (three) times daily as needed for congestion.    Tamsulosin HCl (FLOMAX) 0.4 MG CAPS Take 0.4 mg by mouth at bedtime.    tiZANidine (ZANAFLEX) 4 MG tablet Take 1 tablet by mouth 3 (three) times daily.     Review of Systems      All other systems reviewed and are otherwise negative except as noted above.  Physical Exam    VS:  BP 104/60   Pulse 61   Ht 5\' 11"  (1.803 m)   Wt 237 lb (107.5 kg)   SpO2 97%   BMI 33.05 kg/m  , BMI Body mass index is 33.05 kg/m.  Wt Readings from Last 3 Encounters:  05/21/22 237 lb (107.5 kg)  02/06/22 280 lb (127 kg)  01/22/21 272 lb (123.4 kg)     GEN: Well nourished, well developed, in no acute distress. HEENT: normal. Neck: Supple, no JVD, carotid bruits, or masses. Cardiac: RRR, no murmurs, rubs, or gallops. No clubbing, cyanosis, edema.  Radials/PT 2+ and equal bilaterally.  Respiratory:  Respirations regular and unlabored, clear to auscultation bilaterally. GI: Soft, nontender, nondistended. MS: No  deformity or atrophy. Skin: Warm and dry, no rash. Neuro:  Strength and sensation are intact. Psych: Normal affect.  Assessment & Plan    CAD s/p PCI in 2011 with 3 admission in 2019.  Underwent PCI/DES x1 to mid LAD -Lexiscan Myoview 2020 for DOE showed no ischemia -Continue GDMT: ASA 81 mg, Plavix 75 mg, beta-blocker and statin  Hypertension -BP on the lower end which is unusual for him -Patient states that in the past his systolic blood pressure is usually 120s to 130s  Hyperlipidemia -Most recent lipid panel 3/22 showed total cholesterol 98, HDL 26, LDL 47, triglycerides 170   Disposition: Follow up 1 month with 4/22, MD or APP.  Signed, Armanda Magic, PA-C 05/21/2022, 4:55 PM [facility]

## 2022-05-21 ENCOUNTER — Ambulatory Visit (INDEPENDENT_AMBULATORY_CARE_PROVIDER_SITE_OTHER): Payer: Medicare Other | Admitting: Physician Assistant

## 2022-05-21 ENCOUNTER — Encounter: Payer: Self-pay | Admitting: Physician Assistant

## 2022-05-21 VITALS — BP 104/60 | HR 61 | Ht 71.0 in | Wt 237.0 lb

## 2022-05-21 DIAGNOSIS — R0609 Other forms of dyspnea: Secondary | ICD-10-CM

## 2022-05-21 DIAGNOSIS — Z716 Tobacco abuse counseling: Secondary | ICD-10-CM | POA: Diagnosis not present

## 2022-05-21 DIAGNOSIS — I2510 Atherosclerotic heart disease of native coronary artery without angina pectoris: Secondary | ICD-10-CM

## 2022-05-21 DIAGNOSIS — I10 Essential (primary) hypertension: Secondary | ICD-10-CM | POA: Diagnosis not present

## 2022-05-21 DIAGNOSIS — E785 Hyperlipidemia, unspecified: Secondary | ICD-10-CM

## 2022-05-21 DIAGNOSIS — I214 Non-ST elevation (NSTEMI) myocardial infarction: Secondary | ICD-10-CM

## 2022-05-21 MED ORDER — CLOPIDOGREL BISULFATE 75 MG PO TABS: 75.0000 mg | ORAL_TABLET | Freq: Every day | ORAL | 3 refills | Status: DC

## 2022-05-21 MED ORDER — NITROGLYCERIN 0.4 MG SL SUBL: 0.4000 mg | SUBLINGUAL_TABLET | SUBLINGUAL | 3 refills | Status: AC | PRN

## 2022-05-21 MED ORDER — ROSUVASTATIN CALCIUM 40 MG PO TABS: 40.0000 mg | ORAL_TABLET | Freq: Every day | ORAL | 3 refills | Status: DC

## 2022-05-21 NOTE — Patient Instructions (Signed)
Medication Instructions:  Your physician recommends that you continue on your current medications as directed. Please refer to the Current Medication list given to you today.  *If you need a refill on your cardiac medications before your next appointment, please call your pharmacy*   Lab Work: None If you have labs (blood work) drawn today and your tests are completely normal, you will receive your results only by: MyChart Message (if you have MyChart) OR A paper copy in the mail If you have any lab test that is abnormal or we need to change your treatment, we will call you to review the results.   Testing/Procedures: Your physician has requested that you have an echocardiogram. Echocardiography is a painless test that uses sound waves to create images of your heart. It provides your doctor with information about the size and shape of your heart and how well your heart's chambers and valves are working. This procedure takes approximately one hour. There are no restrictions for this procedure.    Follow-Up: At [facility], you and your health needs are our priority.  As part of our continuing mission to provide you with exceptional heart care, we have created designated Provider Care Teams.  These Care Teams include your primary Cardiologist (physician) and Advanced Practice Providers (APPs -  Physician Assistants and Nurse Practitioners) who all work together to provide you with the care you need, when you need it.  Your next appointment:   1 month(s)  The format for your next appointment:   In Person  Provider:   Armanda Magic, MD  or Jari Favre, PA-C     \  Important Information About Sugar

## 2022-05-26 DIAGNOSIS — Z79891 Long term (current) use of opiate analgesic: Secondary | ICD-10-CM | POA: Insufficient documentation

## 2022-06-01 ENCOUNTER — Telehealth: Payer: Self-pay | Admitting: Physician Assistant

## 2022-06-01 NOTE — Telephone Encounter (Signed)
New Message:    Patient needs to know if he will be allowed to go to the bathroom during his Echo. He have   a bladder problem and have to go to the bathroom a lot. You may leave a message if he does not answer.

## 2022-06-02 NOTE — Telephone Encounter (Signed)
Left message for patient advising him that per echo tech they prefer that patients do not get up to use the restroom during the procedure. Advised if he has any questions or concerns then he can call us back

## 2022-06-05 ENCOUNTER — Ambulatory Visit (HOSPITAL_COMMUNITY): Payer: Medicare Other | Attending: Physician Assistant

## 2022-06-05 DIAGNOSIS — R0609 Other forms of dyspnea: Secondary | ICD-10-CM | POA: Diagnosis present

## 2022-06-05 LAB — ECHOCARDIOGRAM COMPLETE
Area-P 1/2: 3.58 cm2
S' Lateral: 4.1 cm

## 2022-06-21 ENCOUNTER — Other Ambulatory Visit: Payer: Self-pay | Admitting: Cardiology

## 2022-06-21 NOTE — Progress Notes (Unsigned)
Office Visit    Patient Name: Joshua Rojas Date of Encounter: 06/22/2022  PCP:  Tracey Harries, MD   [facility]  Cardiologist:  Armanda Magic, MD  Advanced Practice Provider:  No care team member to display Electrophysiologist:  None   HPI    Joshua Rojas is a 56 y.o. male with a hx of hyperlipidemia, hypertension, anxiety/depression, smoking history, and CAD with remote MI and PCI in 2011 with readmission 2019 with chest pain and NSTEMI underwent PCI with drug-eluting stent x1 to the mid LAD presents today for annual follow-up.  At the time of his last PCI he was started on DAPT with aspirin and Brilinta as well as Imdur 30 mg daily.  He was placed on a high dose statin therapy with atorvastatin.  Unfortunately he had developed shortness of breath in which she was transitioned from Brilinta to Plavix.  He also felt he was not tolerating Lipitor as well as he tolerated simvastatin.  So he was changed back to simvastatin 40 mg daily.  He was seen by Armanda Magic, MD 12/2020.  He was doing well at that time.  He had some DOE about 2 months ago off and on but unknown cause but then mysteriously went away.  He had nasal surgery before his appointment and did well.  He denied any chest pain or pressure, PND, orthopnea, LE edema, dizziness, palpitations or syncope.  He was compliant with all his medications.  He was last seen 05/21/22 by myself and he shared that he is still having some shortness of breath.  It had not necessarily gotten any better from when he was last in our office.  He also was having chest pains every few months.  He states that they are sharp or dull in nature and they are usually in the center of his chest.  He has not needed his nitroglycerin.  His primary ordered a CT coronary to further evaluate.  We also discussed ordering an echocardiogram.  His blood pressure is a little bit low today however he tends to stay dehydrated because of his interstitial  cystitis which is chronic.  He has not experienced any dizziness or lightheadedness. He also is concerned that he has had 2 falls recently.  He states that when he stands up his left leg shakes and then becomes weak and then he falls.  He did not have any lightheadedness or dizziness, syncope, presyncope, or palpitations before or during his falls.  From his history, his falls do not seem particularly cardiac.  I have suggested speaking to his primary about this.   Today, he states he does get a little lightheaded when changing positions such as bending down to pick something up off the ground.  He states that he used to have these terrible sinus headaches and he went taking 250: Sinus plus Tylenol which would help.  The increase in his metoprolol at that point in time helped.  However, his blood pressure has been low as of late.  He did have a 50 lb weight loss recently which was intentional. He is vegan and has been watching his diet.  We reviewed his echocardiogram and answered all questions.  Reports no chest pain, pressure, or tightness. No edema, orthopnea, PND. Reports no palpitations.   Past Medical History    Past Medical History:  Diagnosis Date   Coronary artery disease    s/p remote 1997 and subsequent PCI, remote MI 2011 and s/p NSTEMI 2019 with  cath showing 95% proximal LAD, 80% D1 and occluded OM 2 and underwent PCI/drug-eluting stent x1 to the mid LAD with jailing of a small caliber 80% diagonal   GERD (gastroesophageal reflux disease)    H/O heart artery stent    Hyperlipidemia LDL goal <70    Hypertension    Interstitial cystitis    MI, old    Past Surgical History:  Procedure Laterality Date   CARDIAC CATHETERIZATION     CARDIAC CATHETERIZATION  02/28/2018   CHOLECYSTECTOMY     CORONARY ANGIOPLASTY     CORONARY STENT INTERVENTION  02/28/2018   CORONARY STENT INTERVENTION N/A 02/28/2018   Procedure: CORONARY STENT INTERVENTION;  Surgeon: Lennette Bihari, MD;  Location: [facility]  INVASIVE CV LAB;  Service: Cardiovascular;  Laterality: N/A;   LEFT HEART CATH AND CORONARY ANGIOGRAPHY N/A 02/28/2018   Procedure: LEFT HEART CATH AND CORONARY ANGIOGRAPHY;  Surgeon: Lennette Bihari, MD;  Location: [facility] INVASIVE CV LAB;  Service: Cardiovascular;  Laterality: N/A;    Allergies  Allergies  Allergen Reactions   Benadryl [Diphenhydramine Hcl] Other (See Comments)    Makes interstitial cystitis worse   Sudafed [Pseudoephedrine] Other (See Comments)    Makes interstitial cystitis worse   Triprolidine-Pse Other (See Comments)    Makes interstitial cystitis worse     EKGs/Labs/Other Studies Reviewed:   The following studies were reviewed today:  Echocardiogram 01/2018 Study Conclusions   - Left ventricle: The cavity size was mildly dilated. Systolic    function was normal. The estimated ejection fraction was in the    range of 60% to 65%. Wall motion was normal; there were no    regional wall motion abnormalities. Left ventricular diastolic    function parameters were normal.  - Mitral valve: There was trivial regurgitation.  - Pulmonary arteries: Systolic pressure could not be accurately    estimated.   EKG:  EKG is ordered today.  He has a normal sinus rhythm, rate 61 bpm  Recent Labs: 02/06/2022: BUN 13; Creatinine, Ser 1.21; Hemoglobin 15.3; Platelets 135; Potassium 4.4; Sodium 138  Recent Lipid Panel    Component Value Date/Time   CHOL 90 (L) 01/22/2021 1613   TRIG 170 (H) 01/22/2021 1613   HDL 26 (L) 01/22/2021 1613   CHOLHDL 3.5 01/22/2021 1613   CHOLHDL 4.4 02/26/2018 0558   VLDL 33 02/26/2018 0558   LDLCALC 36 01/22/2021 1613     Home Medications   Current Meds  Medication Sig   ALPRAZolam (XANAX) 1 MG tablet Take 0.5-2 mg by mouth See admin instructions. Take 0.5 mg by mouth two times a day and 2 mg at bedtime   amitriptyline (ELAVIL) 25 MG tablet Take 25 mg by mouth daily.   aspirin EC 81 MG tablet Take 81 mg by mouth daily.    azelastine  (ASTELIN) 0.1 % nasal spray Place 2 sprays into both nostrils daily as needed for rhinitis or allergies.   citalopram (CELEXA) 40 MG tablet Take 40 mg by mouth at bedtime.    clopidogrel (PLAVIX) 75 MG tablet Take 1 tablet (75 mg total) by mouth daily.   fluticasone (FLONASE) 50 MCG/ACT nasal spray Place 2 sprays into both nostrils daily as needed for allergies or rhinitis.   gabapentin (NEURONTIN) 800 MG tablet Take 800 mg by mouth 3 (three) times daily.   HYDROcodone-acetaminophen (NORCO) 10-325 MG per tablet Take 0.5-1 tablets by mouth See admin instructions. Take 1 tablet by mouth three times a day and may take an additional  0.5-1 tablet daily as needed   hydrOXYzine (ATARAX/VISTARIL) 25 MG tablet Take 25 mg by mouth at bedtime.    meloxicam (MOBIC) 7.5 MG tablet Take 1 tablet (7.5 mg total) by mouth daily.   nitroGLYCERIN (NITROSTAT) 0.4 MG SL tablet Place 1 tablet (0.4 mg total) under the tongue every 5 (five) minutes as needed for chest pain.   oxybutynin (DITROPAN) 5 MG tablet Take 1 tablet by mouth as needed.   pantoprazole (PROTONIX) 40 MG tablet TAKE 1 TABLET BY MOUTH EVERY DAY   rosuvastatin (CRESTOR) 40 MG tablet Take 1 tablet (40 mg total) by mouth daily.   sodium chloride (OCEAN) 0.65 % nasal spray Place 1 spray into the nose 3 (three) times daily as needed for congestion.    Tamsulosin HCl (FLOMAX) 0.4 MG CAPS Take 0.4 mg by mouth at bedtime.    tiZANidine (ZANAFLEX) 4 MG tablet Take 1 tablet by mouth 3 (three) times daily.   [DISCONTINUED] lidocaine (LIDODERM) 5 % Place 1 patch onto the skin daily. Remove & Discard patch within 12 hours or as directed by MD   [DISCONTINUED] metoprolol succinate (TOPROL-XL) 50 MG 24 hr tablet Take 1 tablet (50 mg total) by mouth daily. Take with or immediately following a meal.     Review of Systems      All other systems reviewed and are otherwise negative except as noted above.  Physical Exam    VS:  BP 98/60   Pulse 60   Ht 5\' 11"  (1.803  m)   Wt 234 lb (106.1 kg)   SpO2 97%   BMI 32.64 kg/m  , BMI Body mass index is 32.64 kg/m.  Wt Readings from Last 3 Encounters:  06/22/22 234 lb (106.1 kg)  05/21/22 237 lb (107.5 kg)  02/06/22 280 lb (127 kg)     GEN: Well nourished, well developed, in no acute distress. HEENT: normal. Neck: Supple, no JVD, carotid bruits, or masses. Cardiac: RRR, no murmurs, rubs, or gallops. No clubbing, cyanosis, edema.  Radials/PT 2+ and equal bilaterally.  Respiratory:  Respirations regular and unlabored, clear to auscultation bilaterally. GI: Soft, nontender, nondistended. MS: No deformity or atrophy. Skin: Warm and dry, no rash. Neuro:  Strength and sensation are intact. Psych: Normal affect.  Assessment & Plan    CAD s/p PCI in 2011 with 3 admission in 2019.  Underwent PCI/DES x1 to mid LAD -Lexiscan Myoview 2020 for DOE showed no ischemia -Continue GDMT: ASA 81 mg, Plavix 75 mg, beta-blocker and statin  Hypotension -BP on the lower end which is unusual for him -Patient states that in the past his systolic blood pressure is usually 120s to 130s -recent 50 lb weight loss (intentional) which may be the culprit  -will decrease metoprolol succinate to 25 mg -monitor BP daily an hour after morning medications  Hyperlipidemia -Most recent lipid panel 3/22 showed total cholesterol 98, HDL 26, LDL 47, triglycerides 170  4. Mild MR  -06/08/22 echocardiogram  -Will repeat annual echo for surveillance -LVEF 55-60%    Disposition: Follow up 6 months with 08/08/22, MD or APP.  Signed, Armanda Magic, PA-C 06/22/2022, 4:34 PM [facility]

## 2022-06-22 ENCOUNTER — Ambulatory Visit (INDEPENDENT_AMBULATORY_CARE_PROVIDER_SITE_OTHER): Payer: Medicare Other | Admitting: Physician Assistant

## 2022-06-22 ENCOUNTER — Encounter: Payer: Self-pay | Admitting: Physician Assistant

## 2022-06-22 ENCOUNTER — Other Ambulatory Visit: Payer: Self-pay

## 2022-06-22 VITALS — BP 98/60 | HR 60 | Ht 71.0 in | Wt 234.0 lb

## 2022-06-22 DIAGNOSIS — I10 Essential (primary) hypertension: Secondary | ICD-10-CM | POA: Diagnosis not present

## 2022-06-22 DIAGNOSIS — E785 Hyperlipidemia, unspecified: Secondary | ICD-10-CM | POA: Diagnosis not present

## 2022-06-22 DIAGNOSIS — I2510 Atherosclerotic heart disease of native coronary artery without angina pectoris: Secondary | ICD-10-CM

## 2022-06-22 DIAGNOSIS — I340 Nonrheumatic mitral (valve) insufficiency: Secondary | ICD-10-CM | POA: Diagnosis not present

## 2022-06-22 MED ORDER — METOPROLOL SUCCINATE ER 25 MG PO TB24: 25.0000 mg | ORAL_TABLET | Freq: Every day | ORAL | 3 refills | Status: DC

## 2022-06-22 NOTE — Patient Instructions (Addendum)
Medication Instructions:  1.Decrease metoprolol succinate to 25 mg daily *If you need a refill on your cardiac medications before your next appointment, please call your pharmacy*   Lab Work: None If you have labs (blood work) drawn today and your tests are completely normal, you will receive your results only by: MyChart Message (if you have MyChart) OR A paper copy in the mail If you have any lab test that is abnormal or we need to change your treatment, we will call you to review the results.  Follow-Up: At [facility], you and your health needs are our priority.  As part of our continuing mission to provide you with exceptional heart care, we have created designated Provider Care Teams.  These Care Teams include your primary Cardiologist (physician) and Advanced Practice Providers (APPs -  Physician Assistants and Nurse Practitioners) who all work together to provide you with the care you need, when you need it.   Your next appointment:   6 month(s)  The format for your next appointment:   In Person  Provider:   Armanda Magic, MD {  Other Instructions Your physician has requested that you regularly monitor and record your blood pressure and heart rate at home. Please use the same machine at the same time of day to check your readings and record. Let us know if your top number is below 110 or above 140 and/or if your bottom number is below 60 or above 85. Also keep a record of your heart rate and let us know if it is greater than 90.   Make sure to check 2 hours after your morning medications.    AVOID these things for 30 minutes before checking your blood pressure: No Drinking caffeine. No Drinking alcohol. No Eating. No Smoking. No Exercising.   Five minutes before checking your blood pressure: Pee. Sit in a dining chair. Avoid sitting in a soft couch or armchair. Be quiet. Do not talk   Important Information About Sugar

## 2022-12-11 ENCOUNTER — Telehealth: Payer: Self-pay | Admitting: Cardiology

## 2022-12-11 MED ORDER — CLOPIDOGREL BISULFATE 75 MG PO TABS: 75.0000 mg | ORAL_TABLET | Freq: Every day | ORAL | 2 refills | Status: AC

## 2022-12-11 NOTE — Telephone Encounter (Signed)
Pt's medication was sent to pt's pharmacy as requested. Confirmation received.  °

## 2022-12-11 NOTE — Telephone Encounter (Signed)
*  STAT* If patient is at the pharmacy, call can be transferred to refill team.   1. Which medications need to be refilled? (please list name of each medication and dose if known)  clopidogrel (PLAVIX) 75 MG tablet  2. Which pharmacy/location (including street and city if local pharmacy) is medication to be sent to? [id number] - [place], [place] - [address]  3. Do they need a 30 day or 90 day supply?  90 day supply

## 2022-12-23 ENCOUNTER — Other Ambulatory Visit: Payer: Self-pay | Admitting: Cardiology

## 2023-01-27 NOTE — Progress Notes (Deleted)
Cardiology Office Note:    Date:  01/27/2023   ID:  Mills Koller, DOB 11/23/1965, MRN [id number]  PCP:  Bernerd Limbo, MD   [facility] Providers Cardiologist:  Fransico Him, MD { Click to update primary MD,subspecialty MD or APP then REFRESH:1}    Referring MD: Bernerd Limbo, MD   Chief Complaint: ***  History of Present Illness:    Joshua Rojas is a *** 57 y.o. male with a hx of HTN, HLD, anxiety/depression, interstitial cystitis, tobacco abuse, CAD with remote MI and PCI 2011 with readmission 2019 with chest pain and NSTEMI, underwent PCI with DES x 1 to mLAD.   [place] 2020 for DOE showed no ischemia. He developed shortness of breath on Brilinta following PCI and was transition to Plavix. He also did not tolerate Lipitor and was restarted on simvastatin 40 mg daily. He reported DOE 2 months prior to seeing Dr. Radford Pax 12/2020 of unknown cause that went away without clear reason.    Seen by Nicholes Rough, PA 05/21/2022 and shared that he was still having some shortness of breath.  He also had chest pains every few months usually in the center of his chest. He had not taken nitroglycerin. He also reported his legs shaking and then becoming weak causing him to fall. No lightheadedness, palpitations, presyncope, or syncope prior to falls.  Admitted to chronic dehydration secondary to interstitial cystitis. 2D echo 06/05/2022 revealed normal LVEF 55 to 60%, no regional wall motion abnormalities, normal diastolic parameters, normal RV, mild MR.   Last cardiology clinic visit was 06/22/2022 with Nicholes Rough, PA. Intentional weight loss of 50 lbs 2/2 dietary changes. BP was soft and he reports symptom of lightheadedness. Metoprolol was decreased to 25 mg daily and he was advised to monitor home BP. Follow-up in 6 months was recommended.  Today, he is here    Past Medical History:  Diagnosis Date   Coronary artery disease    s/p remote 1997 and subsequent PCI, remote MI 2011 and s/p  NSTEMI 2019 with cath showing 95% proximal LAD, 80% D1 and occluded OM 2 and underwent PCI/drug-eluting stent x1 to the mid LAD with jailing of a small caliber 80% diagonal   GERD (gastroesophageal reflux disease)    H/O heart artery stent    Hyperlipidemia LDL goal <70    Hypertension    Interstitial cystitis    MI, old     Past Surgical History:  Procedure Laterality Date   CARDIAC CATHETERIZATION     CARDIAC CATHETERIZATION  02/28/2018   CHOLECYSTECTOMY     CORONARY ANGIOPLASTY     CORONARY STENT INTERVENTION  02/28/2018   CORONARY STENT INTERVENTION N/A 02/28/2018   Procedure: CORONARY STENT INTERVENTION;  Surgeon: Troy Sine, MD;  Location: [facility];  Service: Cardiovascular;  Laterality: N/A;   LEFT HEART CATH AND CORONARY ANGIOGRAPHY N/A 02/28/2018   Procedure: LEFT HEART CATH AND CORONARY ANGIOGRAPHY;  Surgeon: Troy Sine, MD;  Location: [place] [facility];  Service: Cardiovascular;  Laterality: N/A;    Current Medications: No outpatient medications have been marked as taking for the 02/01/23 encounter (Appointment) with Ann Maki, Lanice Schwab, NP.     Allergies:   Benadryl [diphenhydramine hcl], Sudafed [pseudoephedrine], and Triprolidine-pse   Social History   Socioeconomic History   Marital status: Single    Spouse name: Not on file   Number of children: Not on file   Years of education: Not on file   Highest education level:  Not on file  Occupational History   Not on file  Tobacco Use   Smoking status: Every Day    Packs/day: 1.00    Years: 35.00    Additional pack years: 0.00    Total pack years: 35.00    Types: Cigarettes   Smokeless tobacco: Never  Vaping Use   Vaping Use: Never used  Substance and Sexual Activity   Alcohol use: No   Drug use: No   Sexual activity: Not Currently  Other Topics Concern   Not on file  Social History Narrative   Not on file   Social Determinants of Health   Financial Resource Strain: Not on file   Food Insecurity: Not on file  Transportation Needs: Not on file  Physical Activity: Not on file  Stress: Not on file  Social Connections: Not on file     Family History: The patient's ***family history includes Diabetes in his father.  ROS:   Please see the history of present illness.    *** All other systems reviewed and are negative.  Labs/Other Studies Reviewed:    The following studies were reviewed today:  Cardiac Studies & Procedures   CARDIAC CATHETERIZATION  CARDIAC CATHETERIZATION 02/28/2018  Narrative  Prox LAD lesion is 95% stenosed.  Ost 1st Diag lesion is 80% stenosed.  Ost 2nd Mrg to 2nd Mrg lesion is 100% stenosed.  Post intervention, there is a 0% residual stenosis.  A stent was successfully placed.  Ost Ramus lesion is 50% stenosed.  Multivessel CAD with 95% eccentric proximal LAD stenosis in the region of the first small diagonal takeoff with 80% ostial narrowing in the small diagonal vessel; 50% eccentric ostial stenosis in the ramus intermediate vessel; probable chronic total occlusion of a previously placed circumflex marginal stent.  Normal RCA.  LVEDP 15 mmHg.  Successful PCI to the LAD with cutting balloon/PTCA, and ultimate DES stenting with a Resolute onyx 3.5 x 15 mm stent, postdilated to 3.73 mm with a 95% stenosis being reduced to 0%.  There is no change in the 80% ostial diagonal stenosis and a small caliber vessel which was not intervened upon.  Findings Coronary Findings Diagnostic  Dominance: Right  Left Anterior Descending Prox LAD lesion is 95% stenosed.  First Diagonal Branch Ost 1st Diag lesion is 80% stenosed.  Ramus Intermedius Ost Ramus lesion is 50% stenosed.  Left Circumflex  First Obtuse Marginal Branch Vessel is small in size.  Second Obtuse Marginal Branch Ost 2nd Mrg to 2nd Mrg lesion is 100% stenosed. The lesion was previously treated.  Intervention  Prox LAD lesion Stent A stent was successfully  placed. Post-Intervention Lesion Assessment The intervention was successful. Pre-interventional TIMI flow is 3. Post-intervention TIMI flow is 3. No complications occurred at this lesion. There is a 0% residual stenosis post intervention.     ECHOCARDIOGRAM  ECHOCARDIOGRAM COMPLETE 06/05/2022  Narrative ECHOCARDIOGRAM REPORT   IMPRESSIONS   1. Left ventricular ejection fraction, by estimation, is 55 to 60%. The left ventricle has normal function. The left ventricle has no regional wall motion abnormalities. Left ventricular diastolic parameters were normal. 2. Right ventricular systolic function is normal. The right ventricular size is normal. Tricuspid regurgitation signal is inadequate for assessing PA pressure. 3. The mitral valve is normal in structure. Mild mitral valve regurgitation. 4. The aortic valve is tricuspid. Aortic valve regurgitation is not visualized. 5. The inferior vena cava is normal in size with <50% respiratory variability, suggesting right atrial pressure of 8 mmHg.  Comparison(s): Compared to prior TTE in 01/2018, there is no significant change.  FINDINGS Left Ventricle: Left ventricular ejection fraction, by estimation, is 55 to 60%. The left ventricle has normal function. The left ventricle has no regional wall motion abnormalities. The left ventricular internal cavity size was normal in size. There is no left ventricular hypertrophy. Left ventricular diastolic parameters were normal.  Right Ventricle: The right ventricular size is normal. No increase in right ventricular wall thickness. Right ventricular systolic function is normal. Tricuspid regurgitation signal is inadequate for assessing PA pressure.  Left Atrium: Left atrial size was normal in size.  Right Atrium: Right atrial size was normal in size.  Pericardium: There is no evidence of pericardial effusion.  Mitral Valve: The mitral valve is normal in structure. Mild mitral valve  regurgitation.  Tricuspid Valve: The tricuspid valve is normal in structure. Tricuspid valve regurgitation is trivial.  Aortic Valve: The aortic valve is tricuspid. Aortic valve regurgitation is not visualized.  Pulmonic Valve: The pulmonic valve was normal in structure. Pulmonic valve regurgitation is not visualized.  Aorta: The aortic root and ascending aorta are structurally normal, with no evidence of dilitation.  Venous: The inferior vena cava is normal in size with less than 50% respiratory variability, suggesting right atrial pressure of 8 mmHg.  IAS/Shunts: The atrial septum is grossly normal.                Recent Labs: 02/06/2022: BUN 13; Creatinine, Ser 1.21; Hemoglobin 15.3; Platelets 135; Potassium 4.4; Sodium 138  Recent Lipid Panel    Component Value Date/Time   CHOL 90 (L) 01/22/2021 1613   TRIG 170 (H) 01/22/2021 1613   HDL 26 (L) 01/22/2021 1613   CHOLHDL 3.5 01/22/2021 1613   CHOLHDL 4.4 02/26/2018 0558   VLDL 33 02/26/2018 0558   LDLCALC 36 01/22/2021 1613     Risk Assessment/Calculations:   {Does this patient have ATRIAL FIBRILLATION?:[phone]}       Physical Exam:    VS:  There were no vitals taken for this visit.    Wt Readings from Last 3 Encounters:  06/22/22 234 lb (106.1 kg)  05/21/22 237 lb (107.5 kg)  02/06/22 280 lb (127 kg)     GEN: *** Well nourished, well developed in no acute distress HEENT: Normal NECK: No JVD; No carotid bruits CARDIAC: ***RRR, no murmurs, rubs, gallops RESPIRATORY:  Clear to auscultation without rales, wheezing or rhonchi  ABDOMEN: Soft, non-tender, non-distended MUSCULOSKELETAL:  No edema; No deformity. *** pedal pulses, ***bilaterally SKIN: Warm and dry NEUROLOGIC:  Alert and oriented x 3 PSYCHIATRIC:  Normal affect   EKG:  EKG is *** ordered today.  The ekg ordered today demonstrates ***  No BP recorded.  {Refresh Note OR Click here to enter BP  :1}***    Diagnoses:    No diagnosis  found. Assessment and Plan:     CAD: Cath 01/2018 with multivessel CAD with 95% eccentric proximal LAD stenosis in the region of the first small diagonal takeoff with 80% ostial narrowing in the small diagonal vessel, 50% eccentric ostial stenosis in the ramus intermediate vessel, probable chronic total occlusion of a previously placed circumflex flex marginal stent, normal RCA. Successful cutting balloon/PTCA and ultimate DES to LAD.   Hyperlipidemia LDL goal < 70:  Hypertension:  {Are you ordering a CV Procedure (e.g. stress test, cath, DCCV, TEE, etc)?   Press F2        :[id number]   Disposition:  Medication Adjustments/Labs and Tests Ordered:  Current medicines are reviewed at length with the patient today.  Concerns regarding medicines are outlined above.  No orders of the defined types were placed in this encounter.  No orders of the defined types were placed in this encounter.   There are no Patient Instructions on file for this visit.   Signed, Emmaline Life, NP  01/27/2023 12:36 PM    [place]

## 2023-02-01 ENCOUNTER — Ambulatory Visit: Payer: Medicare Other | Admitting: Nurse Practitioner

## 2023-03-21 NOTE — Progress Notes (Unsigned)
Office Visit    Patient Name: Joshua Rojas Date of Encounter: 03/21/2023  Primary Care Provider:  Tracey Harries, MD Primary Cardiologist:  Armanda Magic, MD Primary Electrophysiologist: None   Past Medical History    Past Medical History:  Diagnosis Date   Coronary artery disease    s/p remote 1997 and subsequent PCI, remote MI 2011 and s/p NSTEMI 2019 with cath showing 95% proximal LAD, 80% D1 and occluded OM 2 and underwent PCI/drug-eluting stent x1 to the mid LAD with jailing of a small caliber 80% diagonal   GERD (gastroesophageal reflux disease)    H/O heart artery stent    Hyperlipidemia LDL goal <70    Hypertension    Interstitial cystitis    MI, old    Past Surgical History:  Procedure Laterality Date   CARDIAC CATHETERIZATION     CARDIAC CATHETERIZATION  02/28/2018   CHOLECYSTECTOMY     CORONARY ANGIOPLASTY     CORONARY STENT INTERVENTION  02/28/2018   CORONARY STENT INTERVENTION N/A 02/28/2018   Procedure: CORONARY STENT INTERVENTION;  Surgeon: Lennette Bihari, MD;  Location: [facility];  Service: Cardiovascular;  Laterality: N/A;   LEFT HEART CATH AND CORONARY ANGIOGRAPHY N/A 02/28/2018   Procedure: LEFT HEART CATH AND CORONARY ANGIOGRAPHY;  Surgeon: Lennette Bihari, MD;  Location: [facility];  Service: Cardiovascular;  Laterality: N/A;    Allergies  Allergies  Allergen Reactions   Benadryl [Diphenhydramine Hcl] Other (See Comments)    Makes interstitial cystitis worse   Sudafed [Pseudoephedrine] Other (See Comments)    Makes interstitial cystitis worse   Triprolidine-Pse Other (See Comments)    Makes interstitial cystitis worse     History of Present Illness    Joshua Rojas  is a 57 year old male with a PMH of CAD s/p MI 1997 treated with PCI in 2011 with readmission in 2019 due to chest pain and NSTEMI treated with PCI with DES x 1 to mid LAD, HLD, HTN, tobacco abuse, anxiety/depression who presents today for 73-month follow-up.  Joshua Rojas was seen initially by Dr. Mayford Knife in 2019 for complaint of chest pain.  He reported that his symptoms were similar to his previous MI and was transported to the ED and given 2 baby aspirin and 1 nitroglycerin.  Troponins peaked to 4.58 and he underwent LHC and was found to have mid LAD stenosis treated with DES x 1 with small CTO of OM2 with jailing of small caliber 80% diagonal.  2D echo was completed showing EF of 60 to 65% and placed and was placed on DAPT with ASA and Brilinta.  He was unable to tolerate Brilinta and this was switched during his follow-up to Plavix.  He underwent ABIs due to complaint of intermittent claudication in 2021 that were normal.  He had complaint of chest pain and he was last seen by Joshua Favre, PA on 06/22/2022.  He had complaints of dizziness and hypotension and metoprolol was decreased to 25 mg daily.  2D echo was also completed showing EF of 55-60% with no RWMA and mild MVR.  Joshua Rojas presents today for 50-month follow-up alone.  Since last being seen in the office patient reports that he is doing well with no new cardiac complaints.  He reports resolution of his dizziness and hypotension.  Patient blood pressure today is well-controlled at 122/78 and heart rate was 62 bpm.  He reports changing his metoprolol to 50 mg instead of 25 mg daily.  He is compliant with his additional medications and denies any adverse reaction.  He is still smoking 1 pack/day and is currently not motivated to quit.  He reports in the past trying Wellbutrin but experienced suicidal ideations.  He has lost a total of 50 pounds and has gained 10 pounds since his previous visit.  We discussed the importance of increasing physical activity to control progression of cardiovascular disease.  He is not very active due to chronic interstitial cystitis.  We discussed strategies and ideas to increase physical activity and patient was advised to exercise at least 150 minutes/week.  He was euvolemic on  examination and denies any episodes of shortness of breath since his previous visit.  Patient denies chest pain, palpitations, dyspnea, PND, orthopnea, nausea, vomiting, dizziness, syncope, edema, weight gain, or early satiety.   Home Medications    Current Outpatient Medications  Medication Sig Dispense Refill   ALPRAZolam (XANAX) 1 MG tablet Take 0.5-2 mg by mouth See admin instructions. Take 0.5 mg by mouth two times a day and 2 mg at bedtime     amitriptyline (ELAVIL) 25 MG tablet Take 25 mg by mouth daily.     aspirin EC 81 MG tablet Take 81 mg by mouth daily.      azelastine (ASTELIN) 0.1 % nasal spray Place 2 sprays into both nostrils daily as needed for rhinitis or allergies.  2   citalopram (CELEXA) 40 MG tablet Take 40 mg by mouth at bedtime.      clopidogrel (PLAVIX) 75 MG tablet Take 1 tablet (75 mg total) by mouth daily. 90 tablet 2   fluticasone (FLONASE) 50 MCG/ACT nasal spray Place 2 sprays into both nostrils daily as needed for allergies or rhinitis.  2   gabapentin (NEURONTIN) 800 MG tablet Take 800 mg by mouth 3 (three) times daily.     HYDROcodone-acetaminophen (NORCO) 10-325 MG per tablet Take 0.5-1 tablets by mouth See admin instructions. Take 1 tablet by mouth three times a day and may take an additional 0.5-1 tablet daily as needed     hydrOXYzine (ATARAX/VISTARIL) 25 MG tablet Take 25 mg by mouth at bedtime.      meloxicam (MOBIC) 7.5 MG tablet Take 1 tablet (7.5 mg total) by mouth daily. 5 tablet 0   metoprolol succinate (TOPROL XL) 25 MG 24 hr tablet Take 1 tablet (25 mg total) by mouth daily. 90 tablet 3   nitroGLYCERIN (NITROSTAT) 0.4 MG SL tablet Place 1 tablet (0.4 mg total) under the tongue every 5 (five) minutes as needed for chest pain. 25 tablet 3   oxybutynin (DITROPAN) 5 MG tablet Take 1 tablet by mouth as needed.     pantoprazole (PROTONIX) 40 MG tablet TAKE 1 TABLET BY MOUTH EVERY DAY 90 tablet 2   rosuvastatin (CRESTOR) 40 MG tablet Take 1 tablet (40 mg  total) by mouth daily. 90 tablet 3   sodium chloride (OCEAN) 0.65 % nasal spray Place 1 spray into the nose 3 (three) times daily as needed for congestion.      Tamsulosin HCl (FLOMAX) 0.4 MG CAPS Take 0.4 mg by mouth at bedtime.      tiZANidine (ZANAFLEX) 4 MG tablet Take 1 tablet by mouth 3 (three) times daily.  11   No current facility-administered medications for this visit.     Review of Systems  Please see the history of present illness.     All other systems reviewed and are otherwise negative except as noted above.  Physical Exam  Wt Readings from Last 3 Encounters:  06/22/22 234 lb (106.1 kg)  05/21/22 237 lb (107.5 kg)  02/06/22 280 lb (127 kg)   RU:EAVWU were no vitals filed for this visit.,There is no height or weight on file to calculate BMI.  Constitutional:      Appearance: Healthy appearance. Not in distress.  Neck:     Vascular: JVD normal.  Pulmonary:     Effort: Pulmonary effort is normal.     Breath sounds: No wheezing. No rales. Diminished in the bases Cardiovascular:     Normal rate. Regular rhythm. Normal S1. Normal S2.      Murmurs: There is no murmur.  Edema:    Peripheral edema absent.  Abdominal:     Palpations: Abdomen is soft non tender. There is no hepatomegaly.  Skin:    General: Skin is warm and dry.  Neurological:     General: No focal deficit present.     Mental Status: Alert and oriented to person, place and time.     Cranial Nerves: Cranial nerves are intact.  EKG/LABS/ Recent Cardiac Studies    ECG personally reviewed by me today -sinus rhythm with rate of 60 bpm and no acute changes consistent with previous EKG.  Cardiac Studies & Procedures   CARDIAC CATHETERIZATION  CARDIAC CATHETERIZATION 02/28/2018  Narrative  Prox LAD lesion is 95% stenosed.  Ost 1st Diag lesion is 80% stenosed.  Ost 2nd Mrg to 2nd Mrg lesion is 100% stenosed.  Post intervention, there is a 0% residual stenosis.  A stent was successfully  placed.  Ost Ramus lesion is 50% stenosed.  Multivessel CAD with 95% eccentric proximal LAD stenosis in the region of the first small diagonal takeoff with 80% ostial narrowing in the small diagonal vessel; 50% eccentric ostial stenosis in the ramus intermediate vessel; probable chronic total occlusion of a previously placed circumflex marginal stent.  Normal RCA.  LVEDP 15 mmHg.  Successful PCI to the LAD with cutting balloon/PTCA, and ultimate DES stenting with a Resolute onyx 3.5 x 15 mm stent, postdilated to 3.73 mm with a 95% stenosis being reduced to 0%.  There is no change in the 80% ostial diagonal stenosis and a small caliber vessel which was not intervened upon.  Findings Coronary Findings Diagnostic  Dominance: Right  Left Anterior Descending Prox LAD lesion is 95% stenosed.  First Diagonal Branch Ost 1st Diag lesion is 80% stenosed.  Ramus Intermedius Ost Ramus lesion is 50% stenosed.  Left Circumflex  First Obtuse Marginal Branch Vessel is small in size.  Second Obtuse Marginal Branch Ost 2nd Mrg to 2nd Mrg lesion is 100% stenosed. The lesion was previously treated.  Intervention  Prox LAD lesion Stent A stent was successfully placed. Post-Intervention Lesion Assessment The intervention was successful. Pre-interventional TIMI flow is 3. Post-intervention TIMI flow is 3. No complications occurred at this lesion. There is a 0% residual stenosis post intervention.     ECHOCARDIOGRAM  ECHOCARDIOGRAM COMPLETE 06/05/2022  Narrative ECHOCARDIOGRAM REPORT    Patient Name:   Joshua Rojas   Date of Exam: 06/05/2022 Medical Rec #:  [id number]     Height:       71.0 in Accession #:    [id number]    Weight:       237.0 lb Date of Birth:  Sep 11, 1966     BSA:          2.266 m Patient Age:    56 years      BP:  111/65 mmHg Patient Gender: M             HR:           51 bpm. Exam Location:  [address]  Procedure: 2D Echo, Cardiac Doppler and Color  Doppler  Indications:    R06.09 SOB  History:        Patient has prior history of Echocardiogram examinations, most recent 02/26/2018. Signs/Symptoms:Shortness of Breath; Risk Factors:Hypertension, Dyslipidemia and Former Smoker.  Sonographer:    Samule Ohm RDCS Referring Phys: [id number] TESSA N CONTE  IMPRESSIONS   1. Left ventricular ejection fraction, by estimation, is 55 to 60%. The left ventricle has normal function. The left ventricle has no regional wall motion abnormalities. Left ventricular diastolic parameters were normal. 2. Right ventricular systolic function is normal. The right ventricular size is normal. Tricuspid regurgitation signal is inadequate for assessing PA pressure. 3. The mitral valve is normal in structure. Mild mitral valve regurgitation. 4. The aortic valve is tricuspid. Aortic valve regurgitation is not visualized. 5. The inferior vena cava is normal in size with <50% respiratory variability, suggesting right atrial pressure of 8 mmHg.  Comparison(s): Compared to prior TTE in 01/2018, there is no significant change.  FINDINGS Left Ventricle: Left ventricular ejection fraction, by estimation, is 55 to 60%. The left ventricle has normal function. The left ventricle has no regional wall motion abnormalities. The left ventricular internal cavity size was normal in size. There is no left ventricular hypertrophy. Left ventricular diastolic parameters were normal.  Right Ventricle: The right ventricular size is normal. No increase in right ventricular wall thickness. Right ventricular systolic function is normal. Tricuspid regurgitation signal is inadequate for assessing PA pressure.  Left Atrium: Left atrial size was normal in size.  Right Atrium: Right atrial size was normal in size.  Pericardium: There is no evidence of pericardial effusion.  Mitral Valve: The mitral valve is normal in structure. Mild mitral valve regurgitation.  Tricuspid Valve: The  tricuspid valve is normal in structure. Tricuspid valve regurgitation is trivial.  Aortic Valve: The aortic valve is tricuspid. Aortic valve regurgitation is not visualized.  Pulmonic Valve: The pulmonic valve was normal in structure. Pulmonic valve regurgitation is not visualized.  Aorta: The aortic root and ascending aorta are structurally normal, with no evidence of dilitation.  Venous: The inferior vena cava is normal in size with less than 50% respiratory variability, suggesting right atrial pressure of 8 mmHg.  IAS/Shunts: The atrial septum is grossly normal.   LEFT VENTRICLE PLAX 2D LVIDd:         5.80 cm   Diastology LVIDs:         4.10 cm   LV e' medial:    7.94 cm/s LV PW:         1.10 cm   LV E/e' medial:  14.2 LV IVS:        0.90 cm   LV e' lateral:   12.50 cm/s LVOT diam:     2.20 cm   LV E/e' lateral: 9.0 LV SV:         91 LV SV Index:   40 LVOT Area:     3.80 cm   RIGHT VENTRICLE             IVC RV S prime:     12.40 cm/s  IVC diam: 1.60 cm TAPSE (M-mode): 1.9 cm  LEFT ATRIUM           Index  RIGHT ATRIUM           Index LA diam:      4.40 cm 1.94 cm/m   RA Pressure: 3.00 mmHg LA Vol (A2C): 54.7 ml 24.14 ml/m  RA Area:     15.80 cm LA Vol (A4C): 88.1 ml 38.87 ml/m  RA Volume:   38.30 ml  16.90 ml/m AORTIC VALVE LVOT Vmax:   110.00 cm/s LVOT Vmean:  68.500 cm/s LVOT VTI:    0.239 m  AORTA Ao Root diam: 3.40 cm Ao Asc diam:  3.10 cm  MITRAL VALVE                TRICUSPID VALVE MV Area (PHT): 3.58 cm     Estimated RAP:  3.00 mmHg MV Decel Time: 212 msec MV E velocity: 113.00 cm/s  SHUNTS MV A velocity: 91.20 cm/s   Systemic VTI:  0.24 m MV E/A ratio:  1.24         Systemic Diam: 2.20 cm  Laurance Flatten MD Electronically signed by Laurance Flatten MD Signature Date/Time: 06/05/2022/7:05:58 PM    Final             Lab Results  Component Value Date   WBC 8.4 02/06/2022   HGB 15.3 02/06/2022   HCT 44.3 02/06/2022   MCV 98.4  02/06/2022   PLT 135 (L) 02/06/2022   Lab Results  Component Value Date   CREATININE 1.21 02/06/2022   BUN 13 02/06/2022   NA 138 02/06/2022   K 4.4 02/06/2022   CL 102 02/06/2022   CO2 28 02/06/2022   Lab Results  Component Value Date   ALT 25 01/22/2021   AST 25 01/22/2021   ALKPHOS 78 01/22/2021   BILITOT 0.5 01/22/2021   Lab Results  Component Value Date   CHOL 90 (L) 01/22/2021   HDL 26 (L) 01/22/2021   LDLCALC 36 01/22/2021   TRIG 170 (H) 01/22/2021   CHOLHDL 3.5 01/22/2021    Lab Results  Component Value Date   HGBA1C 5.4 02/27/2018     Assessment & Plan    1.  Coronary artery disease: -s/p remote MI in 1997 with MI and PCI in 2011 with NSTEMI in 2019 treated with DES to proximal LAD with 80% D1 and occluded OM 2 with jailing of small caliber 8% diagonal.  He underwent a Lexiscan Myoview in 2020 that showed no evidence of ischemia -Today patient reports no chest pain or cardiac complaints since previous visit. -Continue GDMT with ASA 81 mg, Plavix 75 mg, Crestor 40 mg daily, Toprol 50 mg daily -He is currently not active and was encouraged to increase physical activity to at least 150 minutes/week.  2.  Essential hypertension: -Patient's blood pressure today was well-controlled at 122/70 -Continue metoprolol 50 mg daily  3.  Hyperlipidemia: -Patient's LDL cholesterol was checked since 2022 -Patient reports coordinating his cholesterol labs with his PCP. -Continue Crestor as noted above  4.  Morbid obesity: -Patient's current BMI is 33.14 kg/m -Patient was advised to increase physical activity to at least 150 minutes/week -He was also advised to reduce his calorie intake and try Dash type diet or Mediterranean diet.  Disposition: Follow-up with Armanda Magic, MD or APP in 12 months    Medication Adjustments/Labs and Tests Ordered: Current medicines are reviewed at length with the patient today.  Concerns regarding medicines are outlined above.    Signed, Napoleon Form, Leodis Rains, NP 03/21/2023, 3:22 PM [facility]

## 2023-03-22 ENCOUNTER — Ambulatory Visit: Payer: Medicare Other | Attending: Nurse Practitioner | Admitting: Nurse Practitioner

## 2023-03-22 ENCOUNTER — Encounter: Payer: Self-pay | Admitting: Nurse Practitioner

## 2023-03-22 VITALS — BP 122/70 | HR 62 | Ht 71.0 in | Wt 237.6 lb

## 2023-03-22 DIAGNOSIS — E785 Hyperlipidemia, unspecified: Secondary | ICD-10-CM | POA: Diagnosis present

## 2023-03-22 DIAGNOSIS — Z6836 Body mass index (BMI) 36.0-36.9, adult: Secondary | ICD-10-CM | POA: Diagnosis present

## 2023-03-22 DIAGNOSIS — I2510 Atherosclerotic heart disease of native coronary artery without angina pectoris: Secondary | ICD-10-CM | POA: Diagnosis not present

## 2023-03-22 DIAGNOSIS — I10 Essential (primary) hypertension: Secondary | ICD-10-CM | POA: Diagnosis present

## 2023-03-22 MED ORDER — METOPROLOL SUCCINATE ER 50 MG PO TB24: 50.0000 mg | ORAL_TABLET | Freq: Every day | ORAL | 2 refills | Status: DC

## 2023-03-22 NOTE — Patient Instructions (Addendum)
Medication Instructions:  INCREASE Toprol XL to 50mg  Take 1 tablet once day  *If you need a refill on your cardiac medications before your next appointment, please call your pharmacy*   Lab Work: None ordered   Testing/Procedures: None ordered   Follow-Up: At [facility], you and your health needs are our priority.  As part of our continuing mission to provide you with exceptional heart care, we have created designated Provider Care Teams.  These Care Teams include your primary Cardiologist (physician) and Advanced Practice Providers (APPs -  Physician Assistants and Nurse Practitioners) who all work together to provide you with the care you need, when you need it.  We recommend signing up for the patient portal called "MyChart".  Sign up information is provided on this After Visit Summary.  MyChart is used to connect with patients for Virtual Visits (Telemedicine).  Patients are able to view lab/test results, encounter notes, upcoming appointments, etc.  Non-urgent messages can be sent to your provider as well.   To learn more about what you can do with MyChart, go to [email or web address].    Your next appointment:   12 month(s)  Provider:   Armanda Magic, MD     Other Instructions  150 minutes per week of exercise

## 2023-06-18 ENCOUNTER — Other Ambulatory Visit: Payer: Self-pay | Admitting: Physician Assistant

## 2023-06-20 IMAGING — CT CT HEAD W/O CM
4 series · 16 of 47 positions shown, 18 images · non-contrast
Comparison: None.

CLINICAL DATA: Recent fall with head injury, initial encounter



[Series 2: head wo · axial · 0.46mm/px · z∈[-264,-149]mm · 7 of 31 slices shown, 9 images]
[im 4/31  brain]
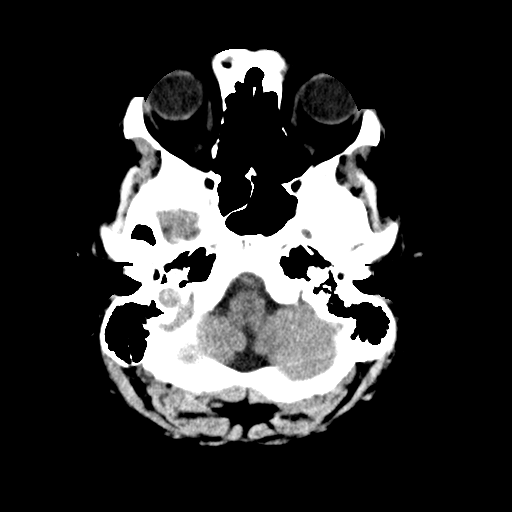
[im 4/31  bone]
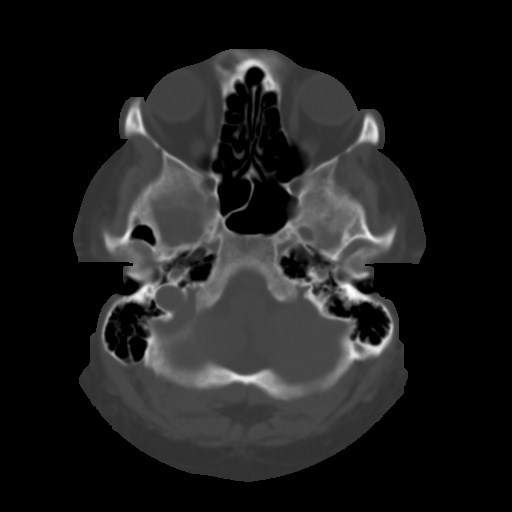
[im 8/31  brain]
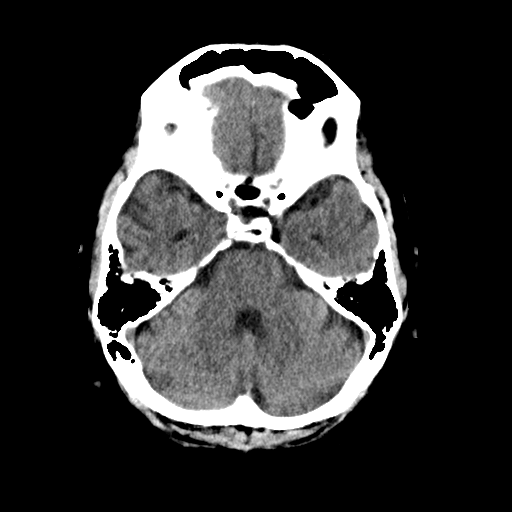
[im 12/31  brain]
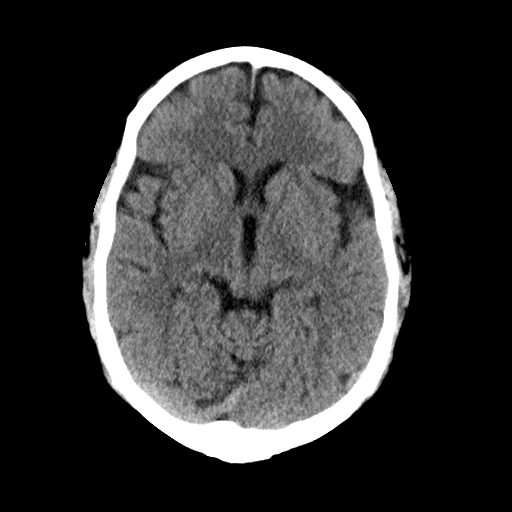
[im 16/31  brain]
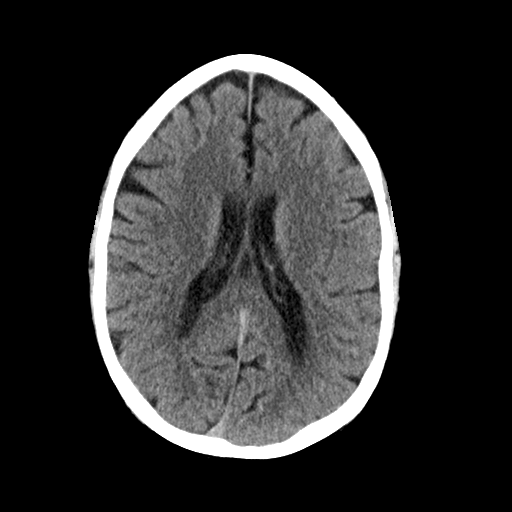
[im 19/31  brain]
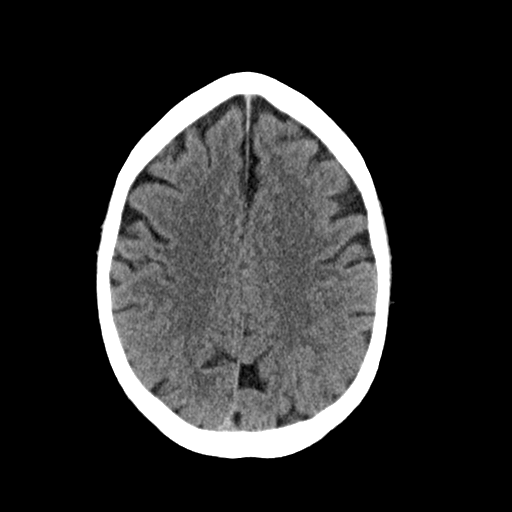
[im 19/31  bone]
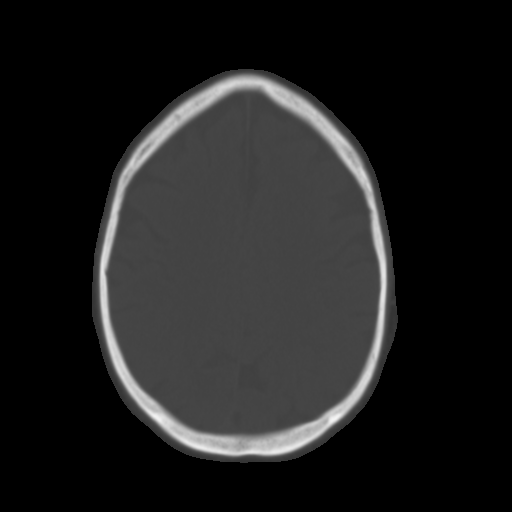
[im 23/31  brain]
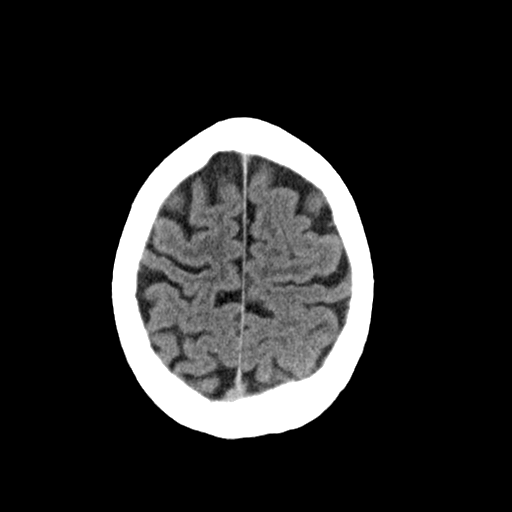
[im 27/31  brain]
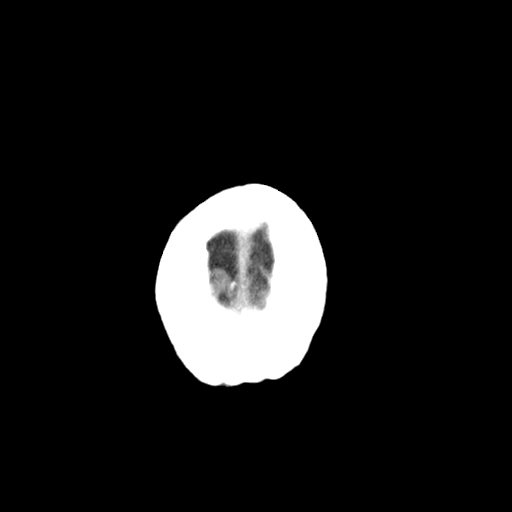

[Series 3: head bone · axial · 0.46mm/px · z∈[-265,-235]mm · 3 of 76 slices shown]
[im 8/76  bone]
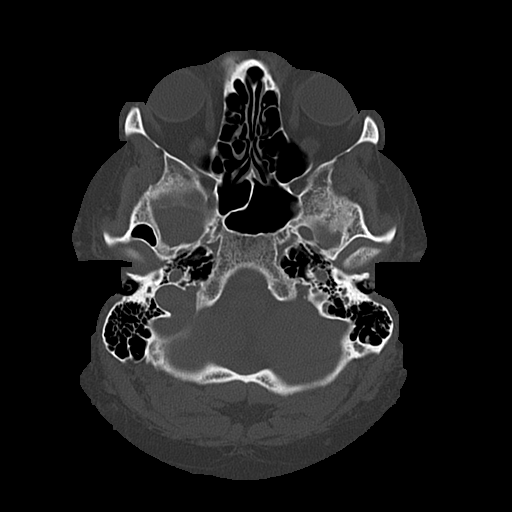
[im 16/76  bone]
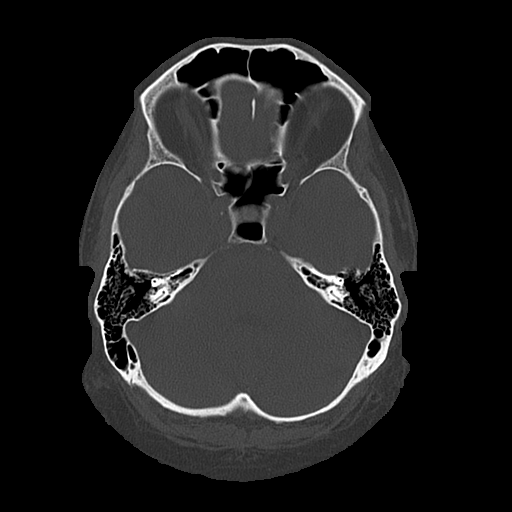
[im 23/76  bone]
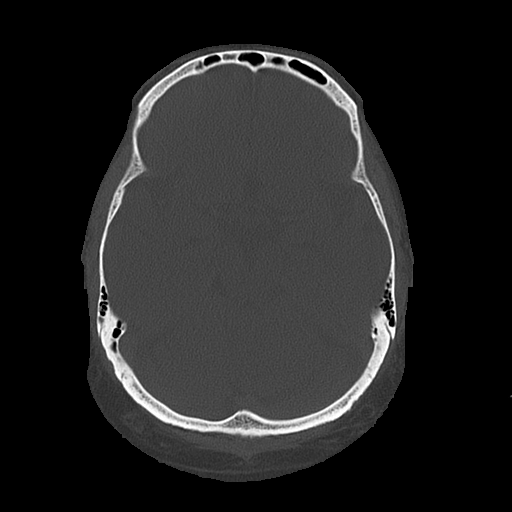

[Series 4: coronal soft · coronal · 0.33mm/px · 3 of 73 slices shown]
[im 25/73  brain]
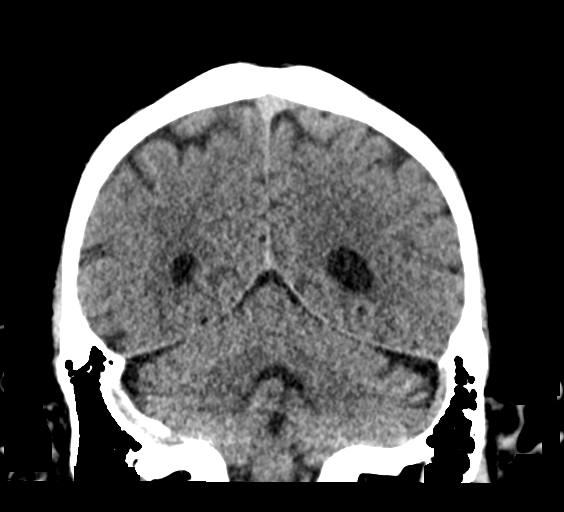
[im 33/73  brain]
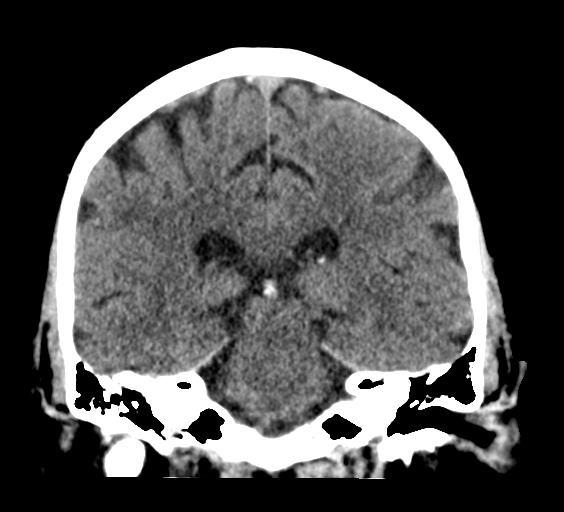
[im 41/73  brain]
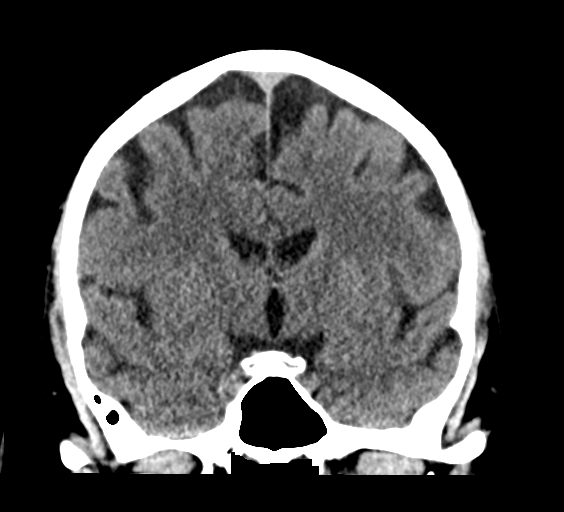

[Series 5: sagittal soft · sagittal · 0.33mm/px · 3 of 62 slices shown]
[im 21/62  brain]
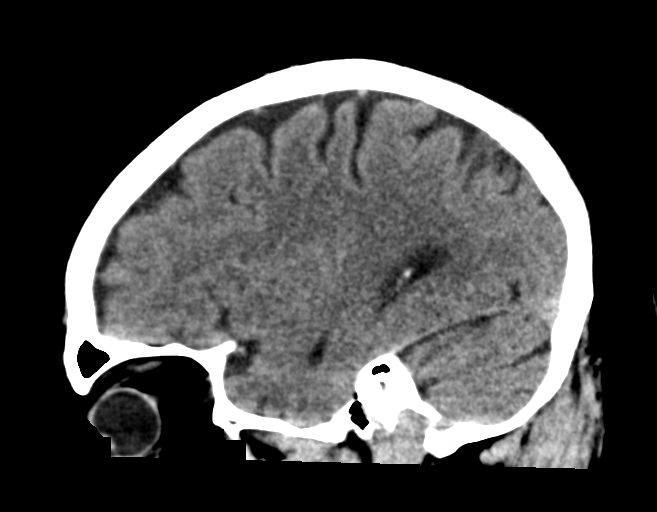
[im 31/62  brain]
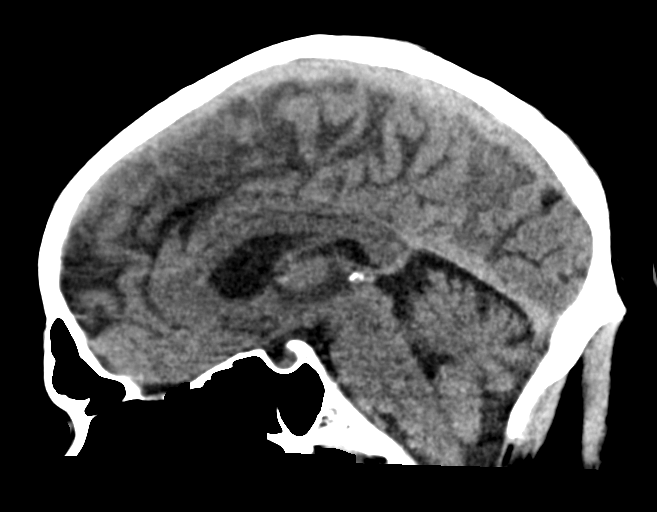
[im 41/62  brain]
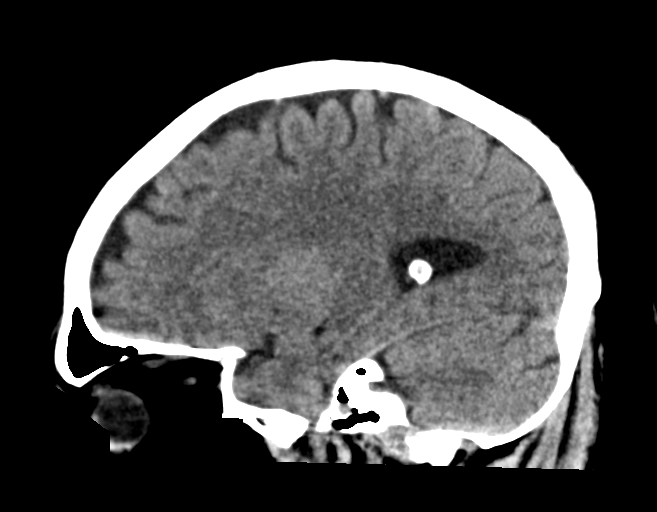

[16 of 47 positions shown; findings below may reference images not displayed]

FINDINGS: Brain: No evidence of acute infarction, hemorrhage, hydrocephalus,
extra-axial collection or mass lesion/mass effect.

Vascular: No hyperdense vessel or unexpected calcification.

Skull: Normal. Negative for fracture or focal lesion.

Sinuses/Orbits: No acute finding.

Other: None.
IMPRESSION: No acute intracranial abnormality noted.

## 2023-10-09 ENCOUNTER — Other Ambulatory Visit: Payer: Self-pay | Admitting: Cardiology

## 2023-11-18 ENCOUNTER — Other Ambulatory Visit: Payer: Self-pay | Admitting: Nurse Practitioner

## 2024-03-24 ENCOUNTER — Other Ambulatory Visit: Payer: Self-pay

## 2024-03-24 MED ORDER — PANTOPRAZOLE SODIUM 40 MG PO TBEC: 40.0000 mg | DELAYED_RELEASE_TABLET | Freq: Every day | ORAL | 0 refills | Status: DC

## 2024-05-30 ENCOUNTER — Encounter: Payer: Self-pay | Admitting: Cardiology

## 2024-05-30 ENCOUNTER — Ambulatory Visit: Payer: Medicare (Managed Care) | Admitting: Cardiology

## 2024-05-30 ENCOUNTER — Ambulatory Visit: Payer: Medicare (Managed Care) | Attending: Cardiology | Admitting: Cardiology

## 2024-05-30 VITALS — BP 108/64 | HR 56 | Ht 71.0 in | Wt 252.6 lb

## 2024-05-30 DIAGNOSIS — I2510 Atherosclerotic heart disease of native coronary artery without angina pectoris: Secondary | ICD-10-CM

## 2024-05-30 DIAGNOSIS — I10 Essential (primary) hypertension: Secondary | ICD-10-CM | POA: Diagnosis not present

## 2024-05-30 DIAGNOSIS — E785 Hyperlipidemia, unspecified: Secondary | ICD-10-CM

## 2024-05-30 MED ORDER — PANTOPRAZOLE SODIUM 40 MG PO TBEC: 40.0000 mg | DELAYED_RELEASE_TABLET | Freq: Every day | ORAL | 3 refills | Status: AC

## 2024-05-30 NOTE — Progress Notes (Deleted)
 Cardiology Office Note:    Date:  05/30/2024   ID:  Joshua Rojas, DOB 09/20/1966, MRN [id number]  PCP:  Pura Lenis, MD  Cardiologist:  Wilbert Bihari, MD    Referring MD: Pura Lenis, MD   No chief complaint on file.   History of Present Illness:    Joshua Rojas is a 58 y.o. male with a hx of CAD with remote MI and PCI in 2011 with readmission for 2019 with chest pain and NSTEMI  Troponin level peaked at 4.58. Cath showed 95% proximal LAD, 80% D1 and occluded OM 2 and underwent PCI/drug-eluting stent x1 to the mid LAD with jailing of a small caliber 80% diagonal.  Left ventricular EF was normal by cath and and echo at 60 to 65%.  He was started on DAPT with aspirin  and Brilinta  and also on Imdur  30 mg daily.  He was also placed on high-dose statin.  Unfortunately he developed shortness of breath resulting in transition from  Brilinta  to Plavix .  He also felt that he was not tolerating Lipitor  as well as he tolerated simvastatin  he was changed back to simvastatin  40 mg daily.  Additional history includes hypertension, anxiety and depression.   He is here for follow-up today and doing well.  He denies any chest pain or pressure, shortness of breath, DOE, PND, orthopnea, lower extreme edema, dizziness, palpitations or syncope.  Past Medical History:  Diagnosis Date   Coronary artery disease    s/p remote 1997 and subsequent PCI, remote MI 2011 and s/p NSTEMI 2019 with cath showing 95% proximal LAD, 80% D1 and occluded OM 2 and underwent PCI/drug-eluting stent x1 to the mid LAD with jailing of a small caliber 80% diagonal   GERD (gastroesophageal reflux disease)    H/O heart artery stent    Hyperlipidemia LDL goal <70    Hypertension    Interstitial cystitis    MI, old     Past Surgical History:  Procedure Laterality Date   CARDIAC CATHETERIZATION     CARDIAC CATHETERIZATION  02/28/2018   CHOLECYSTECTOMY     CORONARY ANGIOPLASTY     CORONARY STENT INTERVENTION  02/28/2018    CORONARY STENT INTERVENTION N/A 02/28/2018   Procedure: CORONARY STENT INTERVENTION;  Surgeon: Burnard Debby LABOR, MD;  Location: [facility];  Service: Cardiovascular;  Laterality: N/A;   LEFT HEART CATH AND CORONARY ANGIOGRAPHY N/A 02/28/2018   Procedure: LEFT HEART CATH AND CORONARY ANGIOGRAPHY;  Surgeon: Burnard Debby LABOR, MD;  Location: [facility];  Service: Cardiovascular;  Laterality: N/A;    Current Medications: No outpatient medications have been marked as taking for the 05/30/24 encounter (Appointment) with Bihari Wilbert SAUNDERS, MD.     Allergies:   Benadryl [diphenhydramine hcl], Sudafed [pseudoephedrine], and Triprolidine-pse   Social History   Socioeconomic History   Marital status: Single    Spouse name: Not on file   Number of children: Not on file   Years of education: Not on file   Highest education level: Not on file  Occupational History   Not on file  Tobacco Use   Smoking status: Every Day    Current packs/day: 1.00    Average packs/day: 1 pack/day for 35.0 years (35.0 ttl pk-yrs)    Types: Cigarettes   Smokeless tobacco: Never  Vaping Use   Vaping status: Never Used  Substance and Sexual Activity   Alcohol use: No   Drug use: No   Sexual activity: Not Currently  Other Topics Concern  Not on file  Social History Narrative   Not on file   Social Drivers of Health   Financial Resource Strain: Low Risk  (05/15/2024)   Received from [facility]   Overall Financial Resource Strain (CARDIA)    How hard is it for you to pay for the very basics like food, housing, medical care, and heating?: Not hard at all  Food Insecurity: No Food Insecurity (05/15/2024)   Received from [facility]   Hunger Vital Sign    Within the past 12 months, you worried that your food would run out before you got the money to buy more.: Never true    Within the past 12 months, the food you bought just didn't last and you didn't have money to get more.: Never true   Transportation Needs: No Transportation Needs (05/15/2024)   Received from [facility] - Transportation    In the past 12 months, has lack of transportation kept you from medical appointments or from getting medications?: No    In the past 12 months, has lack of transportation kept you from meetings, work, or from getting things needed for daily living?: No  Physical Activity: Insufficiently Active (05/24/2023)   Received from [facility]   Exercise Vital Sign    On average, how many days per week do you engage in moderate to strenuous exercise (like a brisk walk)?: 3 days    On average, how many minutes do you engage in exercise at this level?: 20 min  Stress: No Stress Concern Present (02/15/2023)   Received from [facility] - Occupational Stress Questionnaire    Feeling of Stress : Only a little  Social Connections: Socially Integrated (02/15/2023)   Received from [facility]   Social Network    How would you rate your social network (family, work, friends)?: Good participation with social networks     Family History: The patient's family history includes Diabetes in his father.  ROS:   Please see the history of present illness.    ROS  All other systems reviewed and negative.   EKGs/Labs/Other Studies Reviewed:    The following studies were reviewed today: EKG  EKG:  EKG is  ordered today.  The ekg ordered today demonstrates NSR with iRBBB  Recent Labs: No results found for requested labs within last 365 days.   Recent Lipid Panel    Component Value Date/Time   CHOL 90 (L) 01/22/2021 1613   TRIG 170 (H) 01/22/2021 1613   HDL 26 (L) 01/22/2021 1613   CHOLHDL 3.5 01/22/2021 1613   CHOLHDL 4.4 02/26/2018 0558   VLDL 33 02/26/2018 0558   LDLCALC 36 01/22/2021 1613    Physical Exam:    VS:  There were no vitals taken for this visit.    Wt Readings from Last 3 Encounters:  03/22/23 237 lb 9.6 oz (107.8 kg)   06/22/22 234 lb (106.1 kg)  05/21/22 237 lb (107.5 kg)    GEN: Well nourished, well developed in no acute distress HEENT: Normal NECK: No JVD; No carotid bruits LYMPHATICS: No lymphadenopathy CARDIAC:RRR, no murmurs, rubs, gallops RESPIRATORY:  Clear to auscultation without rales, wheezing or rhonchi  ABDOMEN: Soft, non-tender, non-distended MUSCULOSKELETAL:  No edema; No deformity  SKIN: Warm and dry NEUROLOGIC:  Alert and oriented x 3 PSYCHIATRIC:  Normal affect  ASSESSMENT:    1. Coronary artery disease involving native coronary artery of native heart without angina pectoris  2. Benign essential hypertension   3. Hyperlipidemia LDL goal <70   4. Mitral valve insufficiency, unspecified etiology     PLAN:    In order of problems listed above:  1.  ASCAD - s/p remote MI and PCI in 2011  - readmission for 2019 with chest pain and NSTEMI with troponin level peaked at 4.58. Cath showed 95% proximal LAD, 80% D1 and occluded OM 2 and underwent PCI/drug-eluting stent x1 to the mid LAD with jailing of a small caliber 80% diagonal. Hazel bullock 2020 for DOE showed no ischemia -He has not had any anginal symptoms since I saw him last -He will continue aspirin  81 mg daily, Plavix  75 mg daily, Toprol  XL 50 mg daily, Crestor  40 mg daily with as needed refills  2.  HTN - BP is adequately controlled on exam today - Continue Toprol -XL 50 mg daily with as needed refills  3.  HLD -LDL goal is < 70 -I have personally reviewed and interpreted outside labs performed by patient's PCP which showed *** -Continue Crestor  40 mg daily with as needed refills  4.  Mitral regurgitation - Mild by echo 06/2022 - Repeat echo 06/2025   Followup with me in 1 year   Medication Adjustments/Labs and Tests Ordered: Current medicines are reviewed at length with the patient today.  Concerns regarding medicines are outlined above.  No orders of the defined types were placed in this encounter.  No  orders of the defined types were placed in this encounter.   Signed, Wilbert Bihari, MD  05/30/2024 3:25 PM    [facility]

## 2024-05-30 NOTE — Progress Notes (Unsigned)
 Cardiology Office Note    Date:  05/30/2024  ID:  Eyoel Throgmorton, DOB 1966/08/16, MRN [id number] PCP:  Pura Lenis, MD  Cardiologist:  Wilbert Bihari, MD  Electrophysiologist:  None   Chief Complaint: Follow up for CAD   History of Present Illness: .    Joshua Rojas is a 58 y.o. male with visit-pertinent history of CAD s/p MI in 1997 treated with PCI in 2011 with readmission in 2019 due to chest pain and NSTEMI treated with PCI/DES x 1 to the mid LAD, hyperlipidemia, hypertension, tobacco abuse, anxiety/depression.  Patient was first seen by Dr. Bihari in 2019 for complaints of chest pain.  Patient reported that his symptoms were similar to his previous MI and was transported to the ED and given 2 baby aspirin  and 1 nitroglycerin .  LHC indicated mid LAD stenosis treated with DES x 1 with small CTO of OM 2 with jailing of small caliber 80% diagonal.  2D echo was completed showing EF of 60 to 65% and placed on DAPT with aspirin  Brilinta .  Patient was unable to tolerate Brilinta  and was switched during his follow-up Plavix .  ABIs in 2021 were normal.  Echo in 2023 indicated EF 55 to 60%, with no RWMA and mild MVR.  Patient was last seen in clinic on 03/22/2023 by Jackee Alberts, NP.  Patient reported he was doing well with no new cardiac complaints.  Patient continues smoke 1 pack/day.  Patient had previously lost a total of 50 pounds to have gained 10 pounds since his previous visit.  Patient was euvolemic on exam and denied any cardiac concerns or complaints.  Today he presents for follow-up.  He reports that he is doing well overall. He notes some left shoulder pain that is only present with movement of the left arm and with stretching.  He denies any chest pain, tightness or pressure on exertion.  Patient reports some mild dyspnea on exertion that he feels is related to weight gain, notes he had this previously which resolved with weight loss.  Patient reports that he previously lost 50 pounds with  changing of his diet, reports he has not been following his diet as of late and has had a significant amount of weight gain since his last office visit.  He denies any lower extremity edema, orthopnea or PND.  He denies any palpitations, presyncope or syncope.  Patient denies any cardiac concerns or complaints today.  Labwork independently reviewed: 02/08/24: Hemoglobin 16.7, hematocrit 49.2, creatinine 1, sodium 142, potassium 4.1, AST 26, ALT 22 ROS: .   Today he denies chest pain, lower extremity edema, fatigue, palpitations, melena, hematuria, hemoptysis, diaphoresis, weakness, presyncope, syncope, orthopnea, and PND.  All other systems are reviewed and otherwise negative. Studies Reviewed: SABRA   EKG:  EKG is ordered today, personally reviewed, demonstrating  EKG Interpretation Date/Time:  Tuesday May 30 2024 16:27:48 EDT Ventricular Rate:  56 PR Interval:  154 QRS Duration:  88 QT Interval:  442 QTC Calculation: 426 R Axis:   50  Text Interpretation: Sinus bradycardia Nonspecific T wave abnormality Confirmed by Maidie Streight [phone]) on 05/30/2024 6:18:25 PM   CV Studies: Cardiac studies reviewed are outlined and summarized above. Otherwise please see EMR for full report. Cardiac Studies & Procedures   ______________________________________________________________________________________________ CARDIAC CATHETERIZATION  CARDIAC CATHETERIZATION 02/28/2018  Conclusion  Prox LAD lesion is 95% stenosed.  Ost 1st Diag lesion is 80% stenosed.  Ost 2nd Mrg to 2nd Mrg lesion is 100% stenosed.  Post intervention, there is  a 0% residual stenosis.  A stent was successfully placed.  Ost Ramus lesion is 50% stenosed.  Multivessel CAD with 95% eccentric proximal LAD stenosis in the region of the first small diagonal takeoff with 80% ostial narrowing in the small diagonal vessel; 50% eccentric ostial stenosis in the ramus intermediate vessel; probable chronic total occlusion of a previously  placed circumflex marginal stent.  Normal RCA.  LVEDP 15 mmHg.  Successful PCI to the LAD with cutting balloon/PTCA, and ultimate DES stenting with a Resolute onyx 3.5 x 15 mm stent, postdilated to 3.73 mm with a 95% stenosis being reduced to 0%.  There is no change in the 80% ostial diagonal stenosis and a small caliber vessel which was not intervened upon.  Findings Coronary Findings Diagnostic  Dominance: Right  Left Anterior Descending Prox LAD lesion is 95% stenosed.  First Diagonal Branch Ost 1st Diag lesion is 80% stenosed.  Ramus Intermedius Ost Ramus lesion is 50% stenosed.  Left Circumflex  First Obtuse Marginal Branch Vessel is small in size.  Second Obtuse Marginal Branch Ost 2nd Mrg to 2nd Mrg lesion is 100% stenosed. The lesion was previously treated.  Intervention  Prox LAD lesion Stent A stent was successfully placed. Post-Intervention Lesion Assessment The intervention was successful. Pre-interventional TIMI flow is 3. Post-intervention TIMI flow is 3. No complications occurred at this lesion. There is a 0% residual stenosis post intervention.     ECHOCARDIOGRAM  ECHOCARDIOGRAM COMPLETE 06/05/2022  Narrative ECHOCARDIOGRAM REPORT    Patient Name:   Joshua Rojas   Date of Exam: 06/05/2022 Medical Rec #:  [id number]     Height:       71.0 in Accession #:    [id number]    Weight:       237.0 lb Date of Birth:  1966/03/12     BSA:          2.266 m Patient Age:    56 years      BP:           111/65 mmHg Patient Gender: M             HR:           51 bpm. Exam Location:  [address]  Procedure: 2D Echo, Cardiac Doppler and Color Doppler  Indications:    R06.09 SOB  History:        Patient has prior history of Echocardiogram examinations, most recent 02/26/2018. Signs/Symptoms:Shortness of Breath; Risk Factors:Hypertension, Dyslipidemia and Former Smoker.  Sonographer:    Elsie Bohr RDCS Referring Phys: [id number] TESSA N  CONTE  IMPRESSIONS   1. Left ventricular ejection fraction, by estimation, is 55 to 60%. The left ventricle has normal function. The left ventricle has no regional wall motion abnormalities. Left ventricular diastolic parameters were normal. 2. Right ventricular systolic function is normal. The right ventricular size is normal. Tricuspid regurgitation signal is inadequate for assessing PA pressure. 3. The mitral valve is normal in structure. Mild mitral valve regurgitation. 4. The aortic valve is tricuspid. Aortic valve regurgitation is not visualized. 5. The inferior vena cava is normal in size with <50% respiratory variability, suggesting right atrial pressure of 8 mmHg.  Comparison(s): Compared to prior TTE in 01/2018, there is no significant change.  FINDINGS Left Ventricle: Left ventricular ejection fraction, by estimation, is 55 to 60%. The left ventricle has normal function. The left ventricle has no regional wall motion abnormalities. The left ventricular internal cavity size was normal in size. There is no  left ventricular hypertrophy. Left ventricular diastolic parameters were normal.  Right Ventricle: The right ventricular size is normal. No increase in right ventricular wall thickness. Right ventricular systolic function is normal. Tricuspid regurgitation signal is inadequate for assessing PA pressure.  Left Atrium: Left atrial size was normal in size.  Right Atrium: Right atrial size was normal in size.  Pericardium: There is no evidence of pericardial effusion.  Mitral Valve: The mitral valve is normal in structure. Mild mitral valve regurgitation.  Tricuspid Valve: The tricuspid valve is normal in structure. Tricuspid valve regurgitation is trivial.  Aortic Valve: The aortic valve is tricuspid. Aortic valve regurgitation is not visualized.  Pulmonic Valve: The pulmonic valve was normal in structure. Pulmonic valve regurgitation is not visualized.  Aorta: The aortic  root and ascending aorta are structurally normal, with no evidence of dilitation.  Venous: The inferior vena cava is normal in size with less than 50% respiratory variability, suggesting right atrial pressure of 8 mmHg.  IAS/Shunts: The atrial septum is grossly normal.   LEFT VENTRICLE PLAX 2D LVIDd:         5.80 cm   Diastology LVIDs:         4.10 cm   LV e' medial:    7.94 cm/s LV PW:         1.10 cm   LV E/e' medial:  14.2 LV IVS:        0.90 cm   LV e' lateral:   12.50 cm/s LVOT diam:     2.20 cm   LV E/e' lateral: 9.0 LV SV:         91 LV SV Index:   40 LVOT Area:     3.80 cm   RIGHT VENTRICLE             IVC RV S prime:     12.40 cm/s  IVC diam: 1.60 cm TAPSE (M-mode): 1.9 cm  LEFT ATRIUM           Index        RIGHT ATRIUM           Index LA diam:      4.40 cm 1.94 cm/m   RA Pressure: 3.00 mmHg LA Vol (A2C): 54.7 ml 24.14 ml/m  RA Area:     15.80 cm LA Vol (A4C): 88.1 ml 38.87 ml/m  RA Volume:   38.30 ml  16.90 ml/m AORTIC VALVE LVOT Vmax:   110.00 cm/s LVOT Vmean:  68.500 cm/s LVOT VTI:    0.239 m  AORTA Ao Root diam: 3.40 cm Ao Asc diam:  3.10 cm  MITRAL VALVE                TRICUSPID VALVE MV Area (PHT): 3.58 cm     Estimated RAP:  3.00 mmHg MV Decel Time: 212 msec MV E velocity: 113.00 cm/s  SHUNTS MV A velocity: 91.20 cm/s   Systemic VTI:  0.24 m MV E/A ratio:  1.24         Systemic Diam: 2.20 cm  Powell Sorrow MD Electronically signed by Powell Sorrow MD Signature Date/Time: 06/05/2022/7:05:58 PM    Final          ______________________________________________________________________________________________       Current Reported Medications:.    Current Meds  Medication Sig   ALPRAZolam  (XANAX ) 1 MG tablet Take 0.5-2 mg by mouth See admin instructions. Take 0.5 mg by mouth two times a day and 2 mg at bedtime   amitriptyline (ELAVIL)  25 MG tablet Take 25 mg by mouth daily.   aspirin  EC 81 MG tablet Take 81 mg by mouth daily.     azelastine  (ASTELIN ) 0.1 % nasal spray Place 2 sprays into both nostrils daily as needed for rhinitis or allergies.   citalopram  (CELEXA ) 40 MG tablet Take 40 mg by mouth at bedtime.    clopidogrel  (PLAVIX ) 75 MG tablet Take 1 tablet (75 mg total) by mouth daily.   fluticasone  (FLONASE ) 50 MCG/ACT nasal spray Place 2 sprays into both nostrils daily as needed for allergies or rhinitis.   gabapentin  (NEURONTIN ) 800 MG tablet Take 800 mg by mouth 3 (three) times daily.   HYDROcodone -acetaminophen  (NORCO) 10-325 MG per tablet Take 0.5-1 tablets by mouth See admin instructions. Take 1 tablet by mouth three times a day and may take an additional 0.5-1 tablet daily as needed   hydrOXYzine  (ATARAX /VISTARIL ) 25 MG tablet Take 25 mg by mouth at bedtime.    meloxicam  (MOBIC ) 7.5 MG tablet Take 1 tablet (7.5 mg total) by mouth daily.   metoprolol  succinate (TOPROL -XL) 50 MG 24 hr tablet TAKE 1 TABLET BY MOUTH EVERY DAY   naloxone (NARCAN) nasal spray 4 mg/0.1 mL Place 0.4 mg into the nose once.   nitroGLYCERIN  (NITROSTAT ) 0.4 MG SL tablet Place 1 tablet (0.4 mg total) under the tongue every 5 (five) minutes as needed for chest pain.   oxybutynin (DITROPAN) 5 MG tablet Take 1 tablet by mouth as needed.   rosuvastatin  (CRESTOR ) 40 MG tablet TAKE 1 TABLET BY MOUTH EVERY DAY   sodium chloride  (OCEAN) 0.65 % nasal spray Place 1 spray into the nose 3 (three) times daily as needed for congestion.    Tamsulosin  HCl (FLOMAX ) 0.4 MG CAPS Take 0.4 mg by mouth at bedtime.    tiZANidine (ZANAFLEX) 4 MG tablet Take 1 tablet by mouth 3 (three) times daily.   [DISCONTINUED] pantoprazole  (PROTONIX ) 40 MG tablet Take 1 tablet (40 mg total) by mouth daily.    Physical Exam:    VS:  BP 108/64   Pulse (!) 56   Ht 5' 11 (1.803 m)   Wt 252 lb 9.6 oz (114.6 kg)   SpO2 97%   BMI 35.23 kg/m    Wt Readings from Last 3 Encounters:  05/30/24 252 lb 9.6 oz (114.6 kg)  03/22/23 237 lb 9.6 oz (107.8 kg)  06/22/22 234 lb  (106.1 kg)    GEN: Well nourished, well developed in no acute distress NECK: No JVD; No carotid bruits CARDIAC: RRR, no murmurs, rubs, gallops RESPIRATORY:  Clear to auscultation without rales, wheezing or rhonchi  ABDOMEN: Soft, non-tender, non-distended EXTREMITIES:  No edema; No acute deformity     Asessement and Plan:.    CAD: S/p remote MI in 1997 with MI and PCI in 2001 with NSTEMI in 2019 treated with DES to proximal LAD with 80% D1 and occluded OM 2 with jailing of small caliber 8% diagonal.  Lexiscan Myoview in 2020 showed no evidence of ischemia. Today he reports left shoulder pain that worsens with movement and stretching of the arm, patient denies any chest pain, tightness or pressure on exertion.  Patient agrees that shoulder pain is likely musculoskeletal in nature.  Patient also endorses dyspnea on exertion which she feels is related to recent weight gain and deconditioning, he plans to increase exercise and continue monitoring.  Ischemic evaluation was discussed however patient deferred at this time, he will notify the office of any worsening of symptoms. Heart healthy  diet and regular cardiovascular exercise encouraged.   Continue aspirin  81 mg daily, Plavix  75 mg daily, metoprolol  succinate 50 mg daily and Crestor  40 mg daily.  Refill of patient's Protonix  provided.  HTN: Blood pressure today 108/64.  Continue metoprolol  succinate 50 mg daily.  Hyperlipidemia: Last lipid profile on/8/25 indicated total cholesterol 110, triglycerides 118, HDL 40 and LDL 49.  Continue Crestor  40 mg daily.   Disposition: F/u with Dr. Shlomo in six months or sooner if needed.   Signed, Savior Himebaugh D Emmer Lillibridge, NP

## 2024-05-30 NOTE — Patient Instructions (Signed)
 Medication Instructions:  No changes *If you need a refill on your cardiac medications before your next appointment, please call your pharmacy*  Lab Work: No labs If you have labs (blood work) drawn today and your tests are completely normal, you will receive your results only by: MyChart Message (if you have MyChart) OR A paper copy in the mail If you have any lab test that is abnormal or we need to change your treatment, we will call you to review the results.  Testing/Procedures: No testing  Follow-Up: At [facility], you and your health needs are our priority.  As part of our continuing mission to provide you with exceptional heart care, our providers are all part of one team.  This team includes your primary Cardiologist (physician) and Advanced Practice Providers or APPs (Physician Assistants and Nurse Practitioners) who all work together to provide you with the care you need, when you need it.  Your next appointment:   6 month(s)  Provider:   Wilbert Bihari, MD  We recommend signing up for the patient portal called MyChart.  Sign up information is provided on this After Visit Summary.  MyChart is used to connect with patients for Virtual Visits (Telemedicine).  Patients are able to view lab/test results, encounter notes, upcoming appointments, etc.  Non-urgent messages can be sent to your provider as well.   To learn more about what you can do with MyChart, go to [email or web address].

## 2024-05-31 ENCOUNTER — Encounter: Payer: Self-pay | Admitting: Cardiology
